# Patient Record
Sex: Female | Born: 1970 | Hispanic: Yes | Marital: Married | Smoking: Never smoker
Health system: Southern US, Community
[De-identification: ages and names within clinical notes are randomized; demographics above are authoritative.]

## PROBLEM LIST (undated history)

## (undated) DIAGNOSIS — C801 Malignant (primary) neoplasm, unspecified: Secondary | ICD-10-CM

---

## 1998-08-01 HISTORY — PX: CHOLECYSTECTOMY: SHX55

## 2006-05-31 ENCOUNTER — Ambulatory Visit: Payer: Self-pay | Admitting: Family Medicine

## 2012-10-31 ENCOUNTER — Ambulatory Visit: Payer: Self-pay

## 2012-11-28 ENCOUNTER — Ambulatory Visit: Payer: Self-pay

## 2014-10-20 ENCOUNTER — Ambulatory Visit: Payer: Self-pay | Admitting: Family Medicine

## 2015-11-25 ENCOUNTER — Ambulatory Visit: Payer: Self-pay | Attending: Oncology | Admitting: *Deleted

## 2015-11-25 ENCOUNTER — Ambulatory Visit
Admission: RE | Admit: 2015-11-25 | Discharge: 2015-11-25 | Disposition: A | Discharge status: Home / Self Care | Payer: Self-pay | Source: Ambulatory Visit | Attending: Oncology | Admitting: Oncology

## 2015-11-25 ENCOUNTER — Encounter: Payer: Self-pay | Admitting: *Deleted

## 2015-11-25 VITALS — BP 134/85 | HR 75 | Temp 97.7°F | Ht 59.45 in | Wt 161.6 lb

## 2015-11-25 DIAGNOSIS — Z0000 Encounter for general adult medical examination without abnormal findings: Secondary | ICD-10-CM

## 2015-11-25 NOTE — Patient Instructions (Signed)
Gave patient hand-out, Women Staying Healthy, Active and Well from [facility], with education on breast health, pap smears, heart and colon health. 

## 2015-11-25 NOTE — Progress Notes (Signed)
Subjective:     Patient ID: Diane Bates, female   DOB: [birth year], 45 y.o.   MRN: [id number]  HPI   Review of Systems     Objective:   Physical Exam  Pulmonary/Chest: Right breast exhibits no inverted nipple, no mass, no nipple discharge, no skin change and no tenderness. Left breast exhibits no inverted nipple, no mass, no nipple discharge, no skin change and no tenderness.       Assessment:     45 year old Hispanic female presents to [facility] for annual screening.  Lloyda, the interpreter present during the interview and exam.  Clinical breast exam unremarkable.  Taught self breast awareness.  Patient has been screened for eligibility.  She does not have any insurance, Medicare or Medicaid.  She also meets financial eligibility.  Hand-out given on the Affordable Care Act.     Plan:     Screening mammogram ordered.  Will follow-up per [facility] protocol.

## 2015-11-26 ENCOUNTER — Other Ambulatory Visit: Payer: Self-pay | Admitting: *Deleted

## 2015-11-26 DIAGNOSIS — R920 Mammographic microcalcification found on diagnostic imaging of breast: Secondary | ICD-10-CM

## 2015-12-04 ENCOUNTER — Other Ambulatory Visit: Payer: Self-pay | Admitting: *Deleted

## 2015-12-04 ENCOUNTER — Ambulatory Visit
Admission: RE | Admit: 2015-12-04 | Discharge: 2015-12-04 | Disposition: A | Discharge status: Home / Self Care | Payer: Self-pay | Source: Ambulatory Visit | Attending: Oncology | Admitting: Oncology

## 2015-12-04 DIAGNOSIS — R920 Mammographic microcalcification found on diagnostic imaging of breast: Secondary | ICD-10-CM

## 2015-12-07 ENCOUNTER — Other Ambulatory Visit: Payer: Self-pay | Admitting: *Deleted

## 2015-12-07 DIAGNOSIS — R920 Mammographic microcalcification found on diagnostic imaging of breast: Secondary | ICD-10-CM

## 2015-12-08 ENCOUNTER — Other Ambulatory Visit: Payer: Self-pay | Admitting: *Deleted

## 2015-12-08 DIAGNOSIS — R920 Mammographic microcalcification found on diagnostic imaging of breast: Secondary | ICD-10-CM

## 2015-12-14 ENCOUNTER — Ambulatory Visit
Admission: RE | Admit: 2015-12-14 | Discharge: 2015-12-14 | Disposition: A | Discharge status: Home / Self Care | Payer: Self-pay | Source: Ambulatory Visit | Attending: Oncology | Admitting: Oncology

## 2015-12-14 DIAGNOSIS — R920 Mammographic microcalcification found on diagnostic imaging of breast: Secondary | ICD-10-CM

## 2015-12-14 HISTORY — PX: BREAST BIOPSY: SHX20

## 2015-12-16 LAB — SURGICAL PATHOLOGY

## 2015-12-17 NOTE — Progress Notes (Signed)
Attempted to reach patient by phone, with interpreter, but no answer, and voicemail not set up.  Sent letter to be translated requesting that patient call Tanya Nones RN to discuss biopsy results, and recommendation.  Alean Rinne called with patient on the phone.  Pathology reviewed with patient,  and she is scheduled to see Dr. Bary Castilla on 12/29/15 at 11:00 with an interpreter.

## 2015-12-29 ENCOUNTER — Ambulatory Visit (INDEPENDENT_AMBULATORY_CARE_PROVIDER_SITE_OTHER): Payer: PRIVATE HEALTH INSURANCE | Admitting: General Surgery

## 2015-12-29 ENCOUNTER — Other Ambulatory Visit: Payer: Self-pay

## 2015-12-29 ENCOUNTER — Encounter: Payer: Self-pay | Admitting: General Surgery

## 2015-12-29 VITALS — BP 120/78 | HR 74 | Resp 12 | Ht 60.0 in | Wt 162.0 lb

## 2015-12-29 DIAGNOSIS — N6092 Unspecified benign mammary dysplasia of left breast: Secondary | ICD-10-CM

## 2015-12-29 DIAGNOSIS — N62 Hypertrophy of breast: Secondary | ICD-10-CM

## 2015-12-29 DIAGNOSIS — N6320 Unspecified lump in the left breast, unspecified quadrant: Secondary | ICD-10-CM

## 2015-12-29 NOTE — Patient Instructions (Addendum)
Biopsia de mama (Breast Biopsy) Una biopsia de mama es un procedimiento en el cual se extrae Truddie Coco de tejido de la mama. El tejido se examina bajo el microscopio para determinar si hay clulas cancerosas. Una biopsia de mama se realiza cuando hay:  Un bulto en la mama no diagnosticado (tumor).  Anormalidades, hundimientos, costras o ulceraciones en el pezn.  Secrecin anormal del pezn, Dealer.  Enrojecimiento, hinchazn y [place].  Depsitos de calcio (calcificaciones) o anormalidades observadas en Lavinia Sharps, ecografa, o en la resonancia magntica (IRM).  Cambios sospechosos en la mama que se observan en la mamografa. Si se descubre que el tumor es canceroso (maligno) una biopsia de mama puede ayudar a Teacher, adult education cul es el mejor tratamiento para usted. Hay diferentes tipos de biopsia de mama. Hable con su mdico acerca de las opciones y cul es la mejor para usted.  INFORME A SU MDICO SOBRE:   Alergias a alimentos o medicamentos.  Medicamentos que [place], incluyendo vitaminas, hierbas, gotas oftlmicas, medicamentos de venta libre y cremas.  Uso de corticoides (por va oral o cremas).  Problemas anteriores debido a anestsicos o a medicamentos que [employer] sensibilidad.  Antecedentes de hemorragias o cogulos sanguneos.  Cirugas anteriores.  Otros problemas de salud, incluyendo diabetes y problemas renales.  Resfros o infecciones recientes.  Posibilidad de embarazo, si corresponde. [place].  Infeccin.  Reaccin alrgica a los medicamentos.  Hematomas e inflamacin de la mama.  Alteracin en la forma de la mama.  No se encuentra el bulto o la anormalidad.  Necesidad de [place]. ANTES DEL PROCEDIMIENTO   Pdale a alguna persona que la lleve a su casa luego del procedimiento.  No fume al menos las 2 semanas previas al procedimiento. Si fuma, abandone el hbito.  No beba  alcohol durante las 24 horas previas al procedimiento.  Use un buen sostn para el procedimiento.  El mdico puede realizar un procedimiento para Glass blower/designer un alambre (marcacin con Management consultant) o de una semilla que emite radiacin (marcacin con semilla radiactiva) en el ndulo mamario. Durante este procedimiento, se realiza una mamografa o una ecografa para ayudar a Marine scientist. El alambre o la semilla ayudarn al mdico a Pension scheme manager el ndulo cuando realiza la biopsia, especialmente si este no se palpable. PROCEDIMIENTO  Le administrarn medicamentos para adormecer el rea de la mama (anestesia local) o medicamentos para dormir durante el procedimiento (anestesia general). Los siguientes son los diferentes tipos de biopsia que se pueden Optometrist.   Aspiracin con aguja fina: se inserta una aguja delgada con [place] en el tumor de la mama. Con ella se extraen lquido y clulas que luego se observan en el microscopio. Si el tumor no se palpa, se realizar una ecografa para localizar el tumor y Catering manager en el rea correcta.   Biopsia con aguja gruesa: Maxwell Caul de seccin amplia (aguja gruesa) se inserta en el tumor entre 3 y 54 veces para obtener muestras de tejido. Se toma la muestra de tejido. La aguja se Water quality scientist correcto mediante el uso de una ecografa o una radiografa.   Biopsia estereotctica: se utilizan equipos radiogrficos y Ardelia Mems computadora para Physiological scientist imgenes de la tumoracin [place]. La computadora encuentra exactamente el ncleo en el que se debe insertar la aguja. Se toma una muestra de tejido.   Biopsia asistida por vaco: se realiza una pequea incisin (menos de  de pulgada [0,6 cm] ) en  la mama. Un equipo para biopsia que incluye una [place] y un suctor se pasan a travs de la incisin dentro del tejido [place]. El suctor suavemente drena tejido mamario anormal hacia la aguja para extraerlo. Este tipo de biopsia extrae una muestra mayor de  tejido que aquel que se extrae habitualmente con la biopsia con Oletta Lamas. No se necesitan puntos de sutura y habitualmente deja una cicatriz pequea.  Biopsia con aguja gruesa guiada por ultrasonido-Un ultrasonido de alta frecuencia gua a la aguja gruesa al rea de la masa o anormalidad. Se hace una incisin para insertar la aguja. Se toma una muestra de tejido.  Biopsia abierta: se hace una incisin grande en la mama. El mdico intentar extirpar todo el tumor de la mama o todo lo que pueda. DESPUS DEL PROCEDIMIENTO   La conducirn a la zona de recuperacin. Cuando se encuentre bien y no tenga problemas, podr volver a Medical illustrator.  Podr notar hematomas en la mama. Esto es normal.  El mdico puede aplicar un vendaje compresivo sobre la mama durante 24 - 56 horas. El vendaje compresivo se ajuste de manera apretada alrededor del trax para evitar que se acumule lquido debajo de los tejidos.   Esta informacin no tiene Marine scientist el consejo del mdico. Asegrese de hacerle al mdico cualquier pregunta que tenga.   Document Released: 04/27/2005 Document Revised: 04/08/2015 Elsevier Interactive Patient Education [employer].  Patient is scheduled for surgery at [facility] on 01/14/16. She will Pre Admit at the hospital on 01/06/16 at 9:30 am. Patient is aware of dates, time, and instructions.

## 2015-12-29 NOTE — Progress Notes (Signed)
Patient ID: Diane Bates, female   DOB: [birth year], 45 y.o.   MRN: [id number]  Chief Complaint  Patient presents with  . Other    HPI Diane Bates is a 45 y.o. female who presents for a breast evaluation. The most recent mammogram was done on 11/25/15 and left breast stereo biopsy on 12/17/15.  Patient does perform regular self breast checks and gets regular mammograms done.    The patient underwent screening mammogram and was called back for additional views. This confirmed microcalcifications which are new from previous exams. She underwent stereotactic biopsy with identification of atypical ductal hyperplasia  No family history of breast cancer. Interpreter Loyda present.   I personally reviewed the patient's history. HPI  No past medical history on file.  Past Surgical History  Procedure Laterality Date  . Breast biopsy Left 12/14/2015    stereo, path pending  . Cholecystectomy  2000    Family History  Problem Relation Age of Onset  . Breast cancer Neg Hx     Social History Social History  Substance Use Topics  . Smoking status: Never Smoker   . Smokeless tobacco: None  . Alcohol Use: No    No Known Allergies  No current outpatient prescriptions on file.   No current facility-administered medications for this visit.    Review of Systems Review of Systems  Constitutional: Negative.   Respiratory: Negative.   Cardiovascular: Negative.     Blood pressure 120/78, pulse 74, resp. rate 12, height 5' (1.524 m), weight 162 lb (73.483 kg), last menstrual period 10/25/2015.  Physical Exam Physical Exam  Constitutional: She is oriented to person, place, and time. She appears well-developed and well-nourished.  Eyes: Conjunctivae are normal. No scleral icterus.  Neck: Neck supple.  Cardiovascular: Normal rate, regular rhythm and normal heart sounds.   Pulmonary/Chest: Effort normal and breath sounds normal. Right breast exhibits no inverted nipple, no  mass, no nipple discharge, no skin change and no tenderness. Left breast exhibits no inverted nipple, no nipple discharge, no skin change and no tenderness.    Thickening at 12 o'clock 6 cm from nipple left breast.  Abdominal: Soft. Bowel sounds are normal. There is no tenderness.  Lymphadenopathy:    She has no cervical adenopathy.    She has no axillary adenopathy.  Neurological: She is alert and oriented to person, place, and time.  Skin: Skin is warm and dry.    Data Reviewed Screening mammograms dated 11/25/2015 suggested new calcifications in the left breast. BI-RADS 0.  Focal spot compression views of the left breast dated 12/04/2015 suggested a 4 mm group of course calcifications, BI-RADS-4.  Stereotactic biopsy completed 12/14/2015 showed atypical ductal hyperplasia and stromal mucin pools. Excisional biopsy recommended. Mucinous carcinoma could not be excluded.  Ultrasound examination of the left breast was undertaken. This was done to determine if the biopsy site could be identified on ultrasound. In the left breast at the 12:00 position 5 cm from the nipple the area of the recent biopsy is identified measuring 0.8 x 0.9 x 1.5 cm. Biopsy clip is clearly evident.  Assessment    Atypical ductal hyperplasia of the left breast.    Plan    Visual biopsy is been recommended to confirm no upstaging to DCIS or invasive cancer.  The procedure was reviewed in detail with the patient and her husband making use of the translator.    Patient to schedule breast excision at outpatient surgery.  Patient is scheduled for surgery at [facility] on  01/14/16. She will Pre Admit at the hospital on 01/06/16 at 9:30 am. Patient is aware of dates, time, and instructions.   PCP:  Sabino Snipes [place] This information has been scribed by Gaspar Cola CMA.    [name] 12/30/2015, 8:51 PM

## 2015-12-30 DIAGNOSIS — N6092 Unspecified benign mammary dysplasia of left breast: Secondary | ICD-10-CM | POA: Insufficient documentation

## 2015-12-30 NOTE — H&P (Signed)
HPI  Diane Bates is a 45 y.o. female who presents for a breast evaluation. The most recent mammogram was done on 11/25/15 and left breast stereo biopsy on 12/17/15.  Patient does perform regular self breast checks and gets regular mammograms done.  The patient underwent screening mammogram and was called back for additional views. This confirmed microcalcifications which are new from previous exams. She underwent stereotactic biopsy with identification of atypical ductal hyperplasia  No family history of breast cancer.  Interpreter Loyda present.  I personally reviewed the patient's history.  HPI  No past medical history on file.  Past Surgical History   Procedure  Laterality  Date   .  Breast biopsy  Left  12/14/2015     stereo, path pending   .  Cholecystectomy   2000    Family History   Problem  Relation  Age of Onset   .  Breast cancer  Neg Hx     Social History  Social History   Substance Use Topics   .  Smoking status:  Never Smoker   .  Smokeless tobacco:  None   .  Alcohol Use:  No    No Known Allergies  No current outpatient prescriptions on file.    No current facility-administered medications for this visit.    Review of Systems  Review of Systems  Constitutional: Negative.  Respiratory: Negative.  Cardiovascular: Negative.   Blood pressure 120/78, pulse 74, resp. rate 12, height 5' (1.524 m), weight 162 lb (73.483 kg), last menstrual period 10/25/2015.  Physical Exam  Physical Exam  Constitutional: She is oriented to person, place, and time. She appears well-developed and well-nourished.  Eyes: Conjunctivae are normal. No scleral icterus.  Neck: Neck supple.  Cardiovascular: Normal rate, regular rhythm and normal heart sounds.  Pulmonary/Chest: Effort normal and breath sounds normal. Right breast exhibits no inverted nipple, no mass, no nipple discharge, no skin change and no tenderness. Left breast exhibits no inverted nipple, no nipple discharge, no skin  change and no tenderness.    Thickening at 12 o'clock 6 cm from nipple left breast.  Abdominal: Soft. Bowel sounds are normal. There is no tenderness.  Lymphadenopathy:  She has no cervical adenopathy.  She has no axillary adenopathy.  Neurological: She is alert and oriented to person, place, and time.  Skin: Skin is warm and dry.   Data Reviewed  Screening mammograms dated 11/25/2015 suggested new calcifications in the left breast. BI-RADS 0.  Focal spot compression views of the left breast dated 12/04/2015 suggested a 4 mm group of course calcifications, BI-RADS-4.  Stereotactic biopsy completed 12/14/2015 showed atypical ductal hyperplasia and stromal mucin pools. Excisional biopsy recommended. Mucinous carcinoma could not be excluded.  Ultrasound examination of the left breast was undertaken. This was done to determine if the biopsy site could be identified on ultrasound. In the left breast at the 12:00 position 5 cm from the nipple the area of the recent biopsy is identified measuring 0.8 x 0.9 x 1.5 cm. Biopsy clip is clearly evident.  Assessment   Atypical ductal hyperplasia of the left breast.   Plan   Visual biopsy is been recommended to confirm no upstaging to DCIS or invasive cancer.  The procedure was reviewed in detail with the patient and her husband making use of the translator.   Patient to schedule breast excision at outpatient surgery.  Patient is scheduled for surgery at [facility] on 01/14/16. She will Pre Admit at the hospital on 01/06/16 at  9:30 am. Patient is aware of dates, time, and instructions.  PCP: Sabino Snipes [place]  This information has been scribed by Gaspar Cola CMA.  [name]  12/30/2015, 8:51 PM

## 2016-01-06 ENCOUNTER — Encounter
Admission: RE | Admit: 2016-01-06 | Discharge: 2016-01-06 | Disposition: A | Discharge status: Home / Self Care | Payer: Self-pay | Source: Ambulatory Visit | Attending: General Surgery | Admitting: General Surgery

## 2016-01-06 DIAGNOSIS — Z01818 Encounter for other preprocedural examination: Secondary | ICD-10-CM | POA: Insufficient documentation

## 2016-01-06 NOTE — Patient Instructions (Signed)
  Your procedure is scheduled on: January 14, 2016 (Thursday) Report to Same Day Surgery 2nd floor [facility] To find out your arrival time please call [phone] between 1PM - 3PM on January 13, 2016 (Wednesday)  Remember: Instructions that are not followed completely may result in serious medical risk, up to and including death, or upon the discretion of your surgeon and anesthesiologist your surgery may need to be rescheduled.    _x___ 1. Do not eat food or drink liquids after midnight. No gum chewing or hard candies.     ___ 2. No Alcohol for 24 hours before or after surgery.   ____ 3. Bring all medications with you on the day of surgery if instructed.    __x__ 4. Notify your doctor if there is any change in your medical condition     (cold, fever, infections).     Do not wear jewelry, make-up, hairpins, clips or nail polish.  Do not wear lotions, powders, or perfumes. You may wear deodorant.  Do not shave 48 hours prior to surgery. Men may shave face and neck.  Do not bring valuables to the hospital.    [facility] is not responsible for any belongings or valuables.               Contacts, dentures or bridgework may not be worn into surgery.  Leave your suitcase in the car. After surgery it may be brought to your room.  For patients admitted to the hospital, discharge time is determined by your treatment team.   Patients discharged the day of surgery will not be allowed to drive home.    Please read over the following fact sheets that you were given:   [facility] Preparing for Surgery and or MRSA Information   ____ Take these medicines the morning of surgery with A SIP OF WATER:    1.   2.  3.  4.  5.  6.  ____ Fleet Enema (as directed)   _x___ Use CHG Soap or sage wipes as directed on instruction sheet   ____ Use inhalers on the day of surgery and bring to hospital day of surgery  ____ Stop metformin 2 days prior to surgery    ____ Take 1/2 of usual insulin dose  the night before surgery and none on the morning of surgery          _x___ Stop aspirin or coumadin, or plavix (N/A)  _x__ Stop Anti-inflammatories such as Advil, Aleve, Ibuprofen, Motrin, Naproxen,          Naprosyn, Goodies powders or aspirin products. Ok to take Tylenol.   ____ Stop supplements until after surgery.    ____ Bring C-Pap to the hospital.

## 2016-01-14 ENCOUNTER — Ambulatory Visit: Payer: Self-pay | Admitting: Anesthesiology

## 2016-01-14 ENCOUNTER — Encounter
Admission: RE | Disposition: A | Discharge status: Home / Self Care | Payer: Self-pay | Source: Ambulatory Visit | Attending: General Surgery

## 2016-01-14 ENCOUNTER — Ambulatory Visit
Admission: RE | Admit: 2016-01-14 | Discharge: 2016-01-14 | Disposition: A | Discharge status: Home / Self Care | Payer: Self-pay | Source: Ambulatory Visit | Attending: General Surgery | Admitting: General Surgery

## 2016-01-14 ENCOUNTER — Encounter: Payer: Self-pay | Admitting: *Deleted

## 2016-01-14 DIAGNOSIS — R921 Mammographic calcification found on diagnostic imaging of breast: Secondary | ICD-10-CM

## 2016-01-14 DIAGNOSIS — N6092 Unspecified benign mammary dysplasia of left breast: Secondary | ICD-10-CM | POA: Insufficient documentation

## 2016-01-14 DIAGNOSIS — D4860 Neoplasm of uncertain behavior of unspecified breast: Secondary | ICD-10-CM | POA: Diagnosis not present

## 2016-01-14 HISTORY — PX: BREAST EXCISIONAL BIOPSY: SUR124

## 2016-01-14 HISTORY — PX: BREAST BIOPSY: SHX20

## 2016-01-14 LAB — POCT PREGNANCY, URINE: PREG TEST UR: NEGATIVE

## 2016-01-14 SURGERY — BREAST BIOPSY
Anesthesia: General | Laterality: Left | Wound class: Clean

## 2016-01-14 MED ORDER — LIDOCAINE HCL (CARDIAC) 20 MG/ML IV SOLN
INTRAVENOUS | Status: DC | PRN
  Administered 2016-01-14: 100 mg via INTRAVENOUS

## 2016-01-14 MED ORDER — HYDROCODONE-ACETAMINOPHEN 5-325 MG PO TABS
ORAL_TABLET | ORAL | Status: DC
Start: 2016-01-14 — End: 2016-01-14
  Filled 2016-01-14: qty 1

## 2016-01-14 MED ORDER — ONDANSETRON HCL 4 MG/2ML IJ SOLN
INTRAMUSCULAR | Status: DC | PRN
Start: 2016-01-14 — End: 2016-01-14
  Administered 2016-01-14: 4 mg via INTRAVENOUS

## 2016-01-14 MED ORDER — FENTANYL CITRATE (PF) 100 MCG/2ML IJ SOLN
INTRAMUSCULAR | Status: DC
Start: 2016-01-14 — End: 2016-01-14
  Filled 2016-01-14: qty 2

## 2016-01-14 MED ORDER — LACTATED RINGERS IV SOLN
INTRAVENOUS | Status: DC
Start: 2016-01-14 — End: 2016-01-14
  Administered 2016-01-14 (×2): via INTRAVENOUS

## 2016-01-14 MED ORDER — FAMOTIDINE 20 MG PO TABS
20.0000 mg | ORAL_TABLET | Freq: Once | ORAL | Status: AC
  Administered 2016-01-14: 20 mg via ORAL

## 2016-01-14 MED ORDER — ONDANSETRON HCL 4 MG/2ML IJ SOLN: 4.0000 mg | Freq: Once | INTRAMUSCULAR | Status: DC | PRN

## 2016-01-14 MED ORDER — PROPOFOL 10 MG/ML IV BOLUS
INTRAVENOUS | Status: DC | PRN
  Administered 2016-01-14: 180 mg via INTRAVENOUS

## 2016-01-14 MED ORDER — FENTANYL CITRATE (PF) 100 MCG/2ML IJ SOLN
INTRAMUSCULAR | Status: DC | PRN
  Administered 2016-01-14: 100 ug via INTRAVENOUS

## 2016-01-14 MED ORDER — BUPIVACAINE-EPINEPHRINE (PF) 0.5% -1:200000 IJ SOLN
INTRAMUSCULAR | Status: AC
  Filled 2016-01-14: qty 30

## 2016-01-14 MED ORDER — HYDROCODONE-ACETAMINOPHEN 5-325 MG PO TABS: 1.0000 | ORAL_TABLET | ORAL | Status: DC | PRN

## 2016-01-14 MED ORDER — HYDROCODONE-ACETAMINOPHEN 5-325 MG PO TABS
1.0000 | ORAL_TABLET | Freq: Once | ORAL | Status: AC
  Administered 2016-01-14: 1 via ORAL

## 2016-01-14 MED ORDER — DEXAMETHASONE SODIUM PHOSPHATE 10 MG/ML IJ SOLN
INTRAMUSCULAR | Status: DC | PRN
  Administered 2016-01-14: 5 mg via INTRAVENOUS

## 2016-01-14 MED ORDER — MIDAZOLAM HCL 2 MG/2ML IJ SOLN
INTRAMUSCULAR | Status: DC | PRN
  Administered 2016-01-14: 2 mg via INTRAVENOUS

## 2016-01-14 MED ORDER — FENTANYL CITRATE (PF) 100 MCG/2ML IJ SOLN
25.0000 ug | INTRAMUSCULAR | Status: DC | PRN
  Administered 2016-01-14: 25 ug via INTRAVENOUS

## 2016-01-14 MED ORDER — HYDROMORPHONE HCL 1 MG/ML IJ SOLN
INTRAMUSCULAR | Status: DC | PRN
  Administered 2016-01-14: 0.5 mg via INTRAVENOUS

## 2016-01-14 MED ORDER — FAMOTIDINE 20 MG PO TABS
ORAL_TABLET | ORAL | Status: AC
  Administered 2016-01-14: 20 mg via ORAL
  Filled 2016-01-14: qty 1

## 2016-01-14 MED ORDER — BUPIVACAINE-EPINEPHRINE (PF) 0.5% -1:200000 IJ SOLN
INTRAMUSCULAR | Status: DC | PRN
  Administered 2016-01-14: 30 mL

## 2016-01-14 SURGICAL SUPPLY — 38 items
BANDAGE ELASTIC 6 LF NS (GAUZE/BANDAGES/DRESSINGS) ×2 IMPLANT
BLADE SURG 15 STRL SS SAFETY (BLADE) ×4 IMPLANT
BNDG GAUZE 4.5X4.1 6PLY STRL (MISCELLANEOUS) ×2 IMPLANT
CANISTER SUCT 1200ML W/VALVE (MISCELLANEOUS) ×2 IMPLANT
CHLORAPREP W/TINT 26ML (MISCELLANEOUS) ×2 IMPLANT
CNTNR SPEC 2.5X3XGRAD LEK (MISCELLANEOUS)
CONT SPEC 4OZ STER OR WHT (MISCELLANEOUS)
CONTAINER SPEC 2.5X3XGRAD LEK (MISCELLANEOUS) IMPLANT
COVER PROBE FLX POLY STRL (MISCELLANEOUS) ×2 IMPLANT
DEVICE DUBIN SPECIMEN MAMMOGRA (MISCELLANEOUS) ×2 IMPLANT
DRAPE LAPAROTOMY 100X77 ABD (DRAPES) ×2 IMPLANT
DRESSING TELFA 4X3 1S ST N-ADH (GAUZE/BANDAGES/DRESSINGS) ×2 IMPLANT
ELECT CAUTERY BLADE TIP 2.5 (TIP) ×2
ELECT REM PT RETURN 9FT ADLT (ELECTROSURGICAL) ×2
ELECTRODE CAUTERY BLDE TIP 2.5 (TIP) ×1 IMPLANT
ELECTRODE REM PT RTRN 9FT ADLT (ELECTROSURGICAL) ×1 IMPLANT
GAUZE FLUFF 18X24 1PLY STRL (GAUZE/BANDAGES/DRESSINGS) ×2 IMPLANT
GLOVE BIO SURGEON STRL SZ7.5 (GLOVE) ×8 IMPLANT
GLOVE INDICATOR 8.0 STRL GRN (GLOVE) ×6 IMPLANT
GOWN STRL REUS W/ TWL LRG LVL3 (GOWN DISPOSABLE) ×3 IMPLANT
GOWN STRL REUS W/TWL LRG LVL3 (GOWN DISPOSABLE) ×3
HARMONIC SCALPEL FOCUS (MISCELLANEOUS) IMPLANT
KIT RM TURNOVER STRD PROC AR (KITS) ×2 IMPLANT
LABEL OR SOLS (LABEL) ×2 IMPLANT
MARGIN MAP 10MM (MISCELLANEOUS) ×2 IMPLANT
NDL SAFETY 22GX1.5 (NEEDLE) ×2 IMPLANT
NEEDLE HYPO 25X1 1.5 SAFETY (NEEDLE) ×2 IMPLANT
PACK BASIN MINOR ARMC (MISCELLANEOUS) ×2 IMPLANT
STRIP CLOSURE SKIN 1/2X4 (GAUZE/BANDAGES/DRESSINGS) ×2 IMPLANT
SUT ETHILON 3-0 FS-10 30 BLK (SUTURE) ×4
SUT VIC AB 2-0 CT1 27 (SUTURE) ×2
SUT VIC AB 2-0 CT1 TAPERPNT 27 (SUTURE) ×2 IMPLANT
SUT VIC AB 4-0 FS2 27 (SUTURE) ×2 IMPLANT
SUTURE EHLN 3-0 FS-10 30 BLK (SUTURE) ×2 IMPLANT
SWABSTK COMLB BENZOIN TINCTURE (MISCELLANEOUS) ×2 IMPLANT
SYR CONTROL 10ML (SYRINGE) IMPLANT
TAPE TRANSPORE STRL 2 31045 (GAUZE/BANDAGES/DRESSINGS) IMPLANT
WATER STERILE IRR 1000ML POUR (IV SOLUTION) ×2 IMPLANT

## 2016-01-14 NOTE — Discharge Instructions (Signed)
CIRUGIA AMBULATORIA       Instruccionnes de alta    1.  Las drogas que se Statistician en su cuerpo [employer], asi      que por las proximas 24 horas usted no debe:   Conducir Scientist, research (medical)) un automovil   Hacer ninguna decision legal   Tomar ninguna bebida alcoholica  2.  A) Manana puede comenzar una dieta regular.  Es mejor que hoy empiece con                    liquidos y gradualmente anada comidas solidas.       B) Puede comer cualquier comida que desee pero es mejor empezar con liquidos,               luego sopitas con galletas saladas y gradualmente llegar a las comidas solidas.  3.  Por favor avise a su medico inmediatamente si usted tiene algun sangrado anormal,       tiene dificultad con la respiracion, enrojecimiento y Social research officer, government en el sitio de la cirugia,     Captree, fiebro o dolor que se alivia con [place].  4.  A) Su visita posoperatoria (despues de su operacion) es con el         B)  Por favor llame para hacer la cita posoperatoria.  5.  Istrucciones especificas :     Biopsia de mama (Breast Biopsy) Una biopsia de mama es un procedimiento en el cual se extrae Truddie Coco de tejido de la mama. El tejido se examina bajo el microscopio para determinar si hay clulas cancerosas. Una biopsia de mama se realiza cuando hay:  Un bulto en la mama no diagnosticado (tumor).  Anormalidades, hundimientos, costras o ulceraciones en el pezn.  Secrecin anormal del pezn, Dealer.  Enrojecimiento, hinchazn y [place].  Depsitos de calcio (calcificaciones) o anormalidades observadas en Lavinia Sharps, ecografa, o en la resonancia magntica (IRM).  Cambios sospechosos en la mama que se observan en la mamografa. Si se descubre que el tumor es canceroso (maligno) una biopsia de mama puede ayudar a Teacher, adult education cul es el mejor tratamiento para usted. Hay diferentes tipos de biopsia de mama. Hable con su mdico acerca de las opciones y cul  es la mejor para usted.  INFORME A SU MDICO SOBRE:   Alergias a alimentos o medicamentos.  Medicamentos que [place], incluyendo vitaminas, hierbas, gotas oftlmicas, medicamentos de venta libre y cremas.  Uso de corticoides (por va oral o cremas).  Problemas anteriores debido a anestsicos o a medicamentos que [employer] sensibilidad.  Antecedentes de hemorragias o cogulos sanguneos.  Cirugas anteriores.  Otros problemas de salud, incluyendo diabetes y problemas renales.  Resfros o infecciones recientes.  Posibilidad de embarazo, si corresponde. [place].  Infeccin.  Reaccin alrgica a los medicamentos.  Hematomas e inflamacin de la mama.  Alteracin en la forma de la mama.  No se encuentra el bulto o la anormalidad.  Necesidad de [place]. ANTES DEL PROCEDIMIENTO   Pdale a alguna persona que la lleve a su casa luego del procedimiento.  No fume al menos las 2 semanas previas al procedimiento. Si fuma, abandone el hbito.  No beba alcohol durante las 24 horas previas al procedimiento.  Use un buen sostn para el procedimiento.  El mdico puede realizar un procedimiento para Glass blower/designer un alambre (marcacin con Management consultant) o de una semilla que emite radiacin (marcacin con semilla radiactiva) en el ndulo  mamario. Durante este procedimiento, se realiza una mamografa o una ecografa para ayudar a Marine scientist. El alambre o la semilla ayudarn al mdico a Pension scheme manager el ndulo cuando realiza la biopsia, especialmente si este no se palpable. PROCEDIMIENTO  Le administrarn medicamentos para adormecer el rea de la mama (anestesia local) o medicamentos para dormir durante el procedimiento (anestesia general). Los siguientes son los diferentes tipos de biopsia que se pueden Optometrist.   Aspiracin con aguja fina: se inserta una aguja delgada con Cl[place] Jacks en el tumor de la mama. Con ella se extraen lquido y clulas que  luego se observan en el microscopio. Si el tumor no se palpa, se realizar una ecografa para localizar el tumor y Catering manager en el rea correcta.   Biopsia con aguja gruesa: Maxwell Caul de seccin amplia (aguja gruesa) se inserta en el tumor entre 3 y 68 veces para obtener muestras de tejido. Se toma la muestra de tejido. La aguja se Water quality scientist correcto mediante el uso de una ecografa o una radiografa.   Biopsia estereotctica: se utilizan equipos radiogrficos y Ardelia Mems computadora para Physiological scientist imgenes de la tumoracin [place]. La computadora encuentra exactamente el ncleo en el que se debe insertar la aguja. Se toma una muestra de tejido.   Biopsia asistida por vaco: se realiza una pequea incisin (menos de  de pulgada [0,6 cm] ) en la mama. Un equipo para biopsia que incluye una [place] y un suctor se pasan a travs de la incisin dentro del tejido Mayer. El suctor suavemente drena tejido mamario anormal hacia la aguja para extraerlo. Este tipo de biopsia extrae una muestra mayor de tejido que aquel que se extrae habitualmente con la biopsia con Oletta Lamas. No se necesitan puntos de sutura y habitualmente deja una cicatriz pequea.  Biopsia con aguja gruesa guiada por ultrasonido-Un ultrasonido de alta frecuencia gua a la aguja gruesa al rea de la masa o anormalidad. Se hace una incisin para insertar la aguja. Se toma una muestra de tejido.  Biopsia abierta: se hace una incisin grande en la mama. El mdico intentar extirpar todo el tumor de la mama o todo lo que pueda. DESPUS DEL PROCEDIMIENTO   La conducirn a la zona de recuperacin. Cuando se encuentre bien y no tenga problemas, podr volver a Medical illustrator.  Podr notar hematomas en la mama. Esto es normal.  El mdico puede aplicar un vendaje compresivo sobre la mama durante 24 - 59 horas. El vendaje compresivo se ajuste de manera apretada alrededor del trax para evitar que se acumule lquido debajo de los  tejidos.   Esta informacin no tiene Marine scientist el consejo del mdico. Asegrese de hacerle al mdico cualquier pregunta que tenga.   Document Released: 04/27/2005 Document Revised: 04/08/2015 Elsevier Interactive Patient Education 2016 [place]  (Breast Biopsy, Care After)  Estas indicaciones le proporcionan informacin general acerca de cmo deber cuidarse despus del procedimiento. El mdico tambin podr darle instrucciones especficas. Comunquese con el mdico si tiene algn problema o tiene preguntas despus del procedimiento. CUIDADOS EN EL HOGAR   Slo tome los medicamentos segn le indique el mdico.  No tome aspirina.  Mantenga las suturas (puntos) secos cuando se bae.  Proteja la zona de la biopsia. No deje que la zona se inflame.  Evite las actividades que podran tironear y abrir el sitio de la biopsia hasta que su mdico la autorice. Aqu se  incluye:  Elongar.  Estirarse.  La prctica de ejercicios.  Deportes.  Levantar objetos que pesen ms de 3 lb. (1,300 kg.)  Siga su dieta habitual.  Use un buen sostn de soporte durante el tiempo que le indique su mdico.  Cambie los apsitosvendajes) tal como se le indic.  No beba alcohol si toma analgsicos.  Cumpla con los controles mdicos segn las indicaciones. Consulte la fecha en que los resultados estarn disponibles. Asegrese de [employer]. SOLICITE AYUDA DE INMEDIATO SI:   Tiene fiebre.  Aumenta el sangrado (ms de una pequea mancha) en el lugar de la biopsia.  Tiene dificultad para respirar.  Observa una secrecin de color blanco amarillento (pus) que proviene del sitio de la biopsia.  Presenta enrojecimiento, inflamacin (hinchazn),o aumento del dolor en el sitio de la biopsia.  Siente mal olor en el sitio de la biopsia.  La biopsia se abre despus de que le han retirado los [employer] grapas o la [place].  Tiene una erupcin.  Necesita medicamentos ms fuertes. ASEGRESE DE QUE:   Comprende estas instrucciones.  Controlar su enfermedad.  Solicitar ayuda de inmediato si no mejora o si empeora.   Esta informacin no tiene Marine scientist el consejo del mdico. Asegrese de hacerle al mdico cualquier pregunta que tenga.   Document Released: 01/17/2012 Elsevier Interactive Patient Education [employer].

## 2016-01-14 NOTE — H&P (Signed)
No change in clinical history or exam.  For left breast excision for ADH.

## 2016-01-14 NOTE — Anesthesia Preprocedure Evaluation (Signed)
Anesthesia Evaluation  Patient identified by MRN, date of birth, ID band Patient awake    Reviewed: Allergy & Precautions, NPO status , Patient's Chart, lab work & pertinent test results  History of Anesthesia Complications Negative for: history of anesthetic complications  Airway Mallampati: II       Dental   Pulmonary neg pulmonary ROS,           Cardiovascular negative cardio ROS       Neuro/Psych negative neurological ROS     GI/Hepatic negative GI ROS, Neg liver ROS,   Endo/Other  negative endocrine ROS  Renal/GU negative Renal ROS     Musculoskeletal   Abdominal   Peds  Hematology negative hematology ROS (+)   Anesthesia Other Findings   Reproductive/Obstetrics                             Anesthesia Physical Anesthesia Plan  ASA: II  Anesthesia Plan: General   Post-op Pain Management:    Induction: Intravenous  Airway Management Planned: LMA  Additional Equipment:   Intra-op Plan:   Post-operative Plan:   Informed Consent: I have reviewed the patients History and Physical, chart, labs and discussed the procedure including the risks, benefits and alternatives for the proposed anesthesia with the patient or authorized representative who has indicated his/her understanding and acceptance.     Plan Discussed with:   Anesthesia Plan Comments:         Anesthesia Quick Evaluation

## 2016-01-14 NOTE — Transfer of Care (Signed)
Immediate Anesthesia Transfer of Care Note  Patient: Diane Bates  Procedure(s) Performed: Procedure(s): BREAST BIOPSY (Left)  Patient Location: PACU  Anesthesia Type:General  Level of Consciousness: sedated and patient cooperative  Airway & Oxygen Therapy: Patient Spontanous Breathing and Patient connected to face mask oxygen  Post-op Assessment: Report given to RN and Post -op Vital signs reviewed and stable  Post vital signs: Reviewed and stable  Last Vitals:  Filed Vitals:   01/14/16 0613 01/14/16 0822  BP: 126/76 131/75  Pulse: 72 82  Temp: 36.7 C 36.1 C  Resp: 16 17    Last Pain: There were no vitals filed for this visit.       Complications: No apparent anesthesia complications

## 2016-01-14 NOTE — Anesthesia Procedure Notes (Signed)
Procedure Name: LMA Insertion Date/Time: 01/14/2016 7:36 AM Performed by: Justus Memory Pre-anesthesia Checklist: Patient identified, Emergency Drugs available, Suction available and Patient being monitored Patient Re-evaluated:Patient Re-evaluated prior to inductionOxygen Delivery Method: Circle system utilized Preoxygenation: Pre-oxygenation with 100% oxygen Intubation Type: IV induction LMA: LMA inserted LMA Size: 3.5 Number of attempts: 1 Placement Confirmation: positive ETCO2 and breath sounds checked- equal and bilateral

## 2016-01-14 NOTE — Anesthesia Postprocedure Evaluation (Signed)
Anesthesia Post Note  Patient: Diane Bates  Procedure(s) Performed: Procedure(s) (LRB): BREAST BIOPSY (Left)  Patient location during evaluation: PACU Anesthesia Type: General Level of consciousness: awake and alert Pain management: pain level controlled Vital Signs Assessment: post-procedure vital signs reviewed and stable Respiratory status: spontaneous breathing and respiratory function stable Cardiovascular status: stable Anesthetic complications: no    Last Vitals:  Filed Vitals:   01/14/16 0850 01/14/16 0852  BP:  129/76  Pulse: 82 84  Temp:  36.3 C  Resp: 17 17    Last Pain:  Filed Vitals:   01/14/16 0903  PainSc: Asleep                 KEPHART,WILLIAM K

## 2016-01-14 NOTE — Op Note (Signed)
Preoperative diagnosis: Left breast ADH, microcalcifications.  Postoperative diagnosis: Same.  Operative procedure: Left breast wide excision with ultrasound guidance.  Operating surgeon: Ollen Bowl, M.D.  Anesthesia: Gen. by LMA, Marcaine 0.5% with 1-200,000 epinephrine, 30 mL local infiltration.  Estimated blood loss: Less than 5 mL.  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 She was admitted for elective wide excision.  Operative note: With the patient under adequate general anesthesia the area was prepped with ChloraPrep and draped. Marcaine was infiltrated for postoperative analgesia. Ultrasound was used to confirm location of the previously placed biopsy clip at the 12-12:30 o'clock position 5 cm from the nipple. An area for excision was outlined. The skin was incised sharply and a curvilinear incision and hemostasis achieved by electrocautery. The adipose tissue was divided. A 3 x 3 x 3 cm block of breast parenchyma was removed and then an additional 1 cm block laterally removed. Specimens were orientated and specimen radiograph confirmed the clip in the center of the specimen. Hemostasis was electrocautery. The deep tissue was approximated with interrupted 2-0 Vicryl sutures. The adipose layer was approximated in similar fashion. The skin was closed with a running 4-0 Vicryl septic suture. Benzoin, Steri-Strips followed by Telfa dressing, fluff gauze, Kerlix and Ace wrap was applied.  The patient tolerated the procedure well was taken to recovery room stable condition.

## 2016-01-15 LAB — SURGICAL PATHOLOGY

## 2016-01-18 ENCOUNTER — Telehealth: Payer: Self-pay

## 2016-01-18 NOTE — Telephone Encounter (Signed)
-----   Message from [name], MD sent at 01/18/2016  1:40 PM EDT ----- Please as the translator service to contact the patient and let her know that the biopsy was benign. No cancer. We will need a translator at her follow-up visit to review treatment plans. Thank you ----- Message -----    From: [facility] Interface    Sent: 01/15/2016   4:45 PM      To: [name], MD

## 2016-01-18 NOTE — Telephone Encounter (Signed)
Notified patient as instructed via translator services, patient pleased. Discussed follow-up appointments, patient agrees.

## 2016-01-21 ENCOUNTER — Encounter: Payer: Self-pay | Admitting: General Surgery

## 2016-01-21 ENCOUNTER — Ambulatory Visit (INDEPENDENT_AMBULATORY_CARE_PROVIDER_SITE_OTHER): Payer: PRIVATE HEALTH INSURANCE | Admitting: General Surgery

## 2016-01-21 ENCOUNTER — Ambulatory Visit: Payer: PRIVATE HEALTH INSURANCE | Admitting: General Surgery

## 2016-01-21 VITALS — BP 122/76 | Ht 60.0 in | Wt 163.0 lb

## 2016-01-21 DIAGNOSIS — N6092 Unspecified benign mammary dysplasia of left breast: Secondary | ICD-10-CM

## 2016-01-21 DIAGNOSIS — N6089 Other benign mammary dysplasias of unspecified breast: Secondary | ICD-10-CM

## 2016-01-21 MED ORDER — TAMOXIFEN CITRATE 20 MG PO TABS: 20.0000 mg | ORAL_TABLET | Freq: Every day | ORAL | Status: DC

## 2016-01-21 NOTE — Patient Instructions (Addendum)
The patient is aware to call back for any questions or concerns.Tamoxifen daily  Tamoxifen oral tablet Qu es este medicamento? El TAMOXIFENO bloquea los efectos de la hormona estrognica. El medicamento se [place] generalmente para el tratamiento del cncer de mama. Se utiliza tambin para reducir la posibilidad de que el cncer de mama vuelva a aparecer en mujeres tratadas anteriormente para esta enfermedad. Adems, ayuda a Manufacturing engineer de mama en algunas mujeres que tienen alto riesgo de desarrollarlo. Este medicamento puede ser utilizado para otros usos; si tiene alguna pregunta consulte con su proveedor de atencin mdica o con su farmacutico. Qu le debo informar a mi profesional de la salud antes de tomar este medicamento? Necesita saber si usted presenta alguno de los siguientes problemas o situaciones: -cogulos sanguneos -trastornos sanguneos -cataratas o deterioro de la vista -endometriosis -niveles elevados de calcio -alto nivel de colesterol -ciclos menstruales irregulares -enfermedad heptica -derrame cerebral -fibroides uterinos -una reaccin alrgica o inusual al tamoxifeno, a otros medicamentos, alimentos, colorantes o conservantes -si est embarazada o buscando quedar embarazada -si est amamantando a un beb Cmo debo utilizar este medicamento? Tome este medicamento por va oral con un vaso de agua. Siga las instrucciones de la etiqueta del [place]. Este medicamento se puede tomar con o sin alimentos. Tome sus dosis a intervalos regulares. No utilice su medicamento con una frecuencia mayor a la indicada. No deje de tomarlo excepto si as lo indica su mdico. Su farmacutico le dar una [place] del medicamento especial con cada receta y relleno. Asegrese de leer esta informacin cada vez cuidadosamente. Hable con su pediatra para informarse acerca del uso de este medicamento en nios. Aunque este medicamento puede ser recetado para condiciones selectivas, las  precauciones se aplican. Sobredosis: Pngase en contacto inmediatamente con un centro toxicolgico o una sala de urgencia si usted cree que haya tomado demasiado medicamento. ATENCIN: [employer] es solo para usted. No comparta este medicamento con nadie. Qu sucede si me olvido de una dosis? Si olvida una dosis, tmela lo antes posible. Si es casi la hora de la prxima dosis, tome slo esa dosis. No tome dosis adicionales o dobles. Qu puede interactuar con este medicamento? -aminoglutemida -bromocriptina -medicamentos quimioteraputicos -hormonas femeninas, como estrgenos y pldoras anticonceptivas -letrozol -medroxiprogesterona -fenobarbital -rifampicina -warfarina Puede ser que esta lista no menciona todas las posibles interacciones. Informe a su profesional de [employer]	[place] de [employer] productos a base de hierbas, medicamentos de [place] o suplementos nutritivos que est tomando. Si usted fuma, consume bebidas alcohlicas o si utiliza drogas ilegales, indqueselo tambin a su profesional de [employer]	[place]. Algunas sustancias pueden interactuar con su medicamento. A qu debo estar atento al usar Coca[employer]Cola? Visite a su mdico o a su profesional de la salud para chequear su evolucin peridicamente. Deber hacerse exmenes de las mamas, pelvis y [place] en forma regular. Si est tomando Coca[employer]Cola para reducir el riesgo de Optician, dispensing de mama, debe saber que este medicamento no previene todos los tipos de cncer de mama. Si contrae cncer de mama u otro problema, no hay garantas de que se detecten en las primeras fases. No se debe quedar embarazada mientras toma este medicamento o durante 2 meses despus de suspender el tratamiento con el medicamento. Deje de tomar este medicamento si cree que puede haber quedado embarazada y consulte a su mdico. Este medicamento puede daar al beb sin nacer. Las mujeres que puedan quedar embarazadas debern usar mtodos  anticonceptivos que no utilicen hormonas durante el tratamiento  de tamoxifeno y durante 2 meses despus de suspender la terapia. Solicite asesoramiento sobre mtodos anticonceptivos a su proveedor de Geophysical data processor. No debe amamantar a un beb mientras est tomando este medicamento. Qu efectos secundarios puedo tener al [employer] este medicamento? Efectos secundarios que debe informar a su mdico o a Barrister's clerk de la salud tan pronto como sea posible: -cambios en la visin (visin borrosa) -cambios en su ciclo menstrual -dificultad al respirar o falta de aliento -dificultad para caminar o hablar -nuevos bultos en las mamas -entumecimiento -presin o dolor plvico -enrojecimiento, formacin de ampollas, descamacin o distensin de la piel, inclusive dentro de la boca -erupcin cutnea o picazn (urticarias) -dolor repentino en el pecho -hinchazn de los labios, la cara o la lengua -hinchazn, dolor o sensibilidad en las pantorrillas o en las piernas -magulladuras o sangrado -flujo vaginal de color marrn, ladrillo o con sangre -debilidad -color amarillento de los ojos o la piel Efectos secundarios que, por lo general, no requieren atencin mdica (debe informarlos a su mdico o a su profesional de la salud si persisten o si son molestos): -fatiga -cada del cabello, aunque es poco comn y generalmente leve -dolor de cabeza -sofocos -impotencia (en los hombres) -nuseas, vmito (leve) -secreciones vaginales (blanca o clara) Puede ser que esta lista no menciona todos los posibles efectos secundarios. Comunquese a su mdico por asesoramiento mdico [employer] Usted puede informar los efectos secundarios a la FDA por telfono al 1-800-FDA-1088. Dnde debo guardar mi medicina? Mantngala fuera del alcance de los nios. Gurdela a [employer], entre 20 y 97 grados C (30 y 47 grados F). Protjala de la luz. Mantenga el envase bien cerrado. Deseche los  medicamentos que no haya utilizado, despus de la fecha de vencimiento. ATENCIN: Este folleto es un resumen. Puede ser que no cubra toda la posible informacin. Si usted tiene preguntas acerca de esta medicina, consulte con su mdico, su farmacutico o su profesional de Technical sales engineer.    2016, Elsevier/Gold Standard. (2014-09-09 00:00:00)  Aspirin daily

## 2016-01-21 NOTE — Progress Notes (Signed)
Patient ID: Diane Bates, female   DOB: Jul 08, 1971, 45 y.o.   MRN: [id number]  Chief Complaint  Patient presents with  . Routine Post Op    HPI Diane Bates is a 45 y.o. female.  Here today for postoperative visit, left breast mass excision. She states she is getting along well. Minimal to no pain, using tylenol as needed.  She is here Today with her daughter, Treasa School and husband, Antoine Poche  Interpreter, Johnsie Cancel present for interview, exam and discussion.  I personally reviewed the history.  HPI  No past medical history on file.  Past Surgical History  Procedure Laterality Date  . Cholecystectomy  2000  . Breast biopsy Left 12/14/2015    stereo, atypical hyperplasia  . Breast excisional biopsy Left 01/14/2016  . Breast biopsy Left 01/14/2016    Procedure: BREAST BIOPSY;  Surgeon: [name], MD;  Location: [facility] ORS;  Service: General;  Laterality: Left;    Family History  Problem Relation Age of Onset  . Breast cancer Neg Hx     Social History Social History  Substance Use Topics  . Smoking status: Never Smoker   . Smokeless tobacco: Never Used  . Alcohol Use: No    No Known Allergies  Current Outpatient Prescriptions  Medication Sig Dispense Refill  . acetaminophen (TYLENOL) 500 MG tablet Take 500 mg by mouth every 6 (six) hours as needed.    . tamoxifen (NOLVADEX) 20 MG tablet Take 1 tablet (20 mg total) by mouth daily. 30 tablet 0   No current facility-administered medications for this visit.    Review of Systems Review of Systems  Constitutional: Negative.   Respiratory: Negative.   Cardiovascular: Negative.     Blood pressure 122/76, height 5' (1.524 m), weight 163 lb (73.936 kg), last menstrual period 01/12/2016.  Physical Exam Physical Exam  Constitutional: She is oriented to person, place, and time. She appears well-developed and well-nourished.  Pulmonary/Chest:    Left breast incision healing well.   Neurological: She is alert and oriented to person, place, and time.  Skin: Skin is warm and dry.  Psychiatric: Her behavior is normal.    Data Reviewed 01/14/2016 results:  A. BREAST, LEFT; WIDE EXCISION:  - RESIDUAL STROMAL MUCIN POOL AND CALCIFICATION, 1 MM, ADJACENT TO  BIOPSY SITE.  - BIOPSY SITE CHANGES AND MARKER CLIP PRESENT.  - NEGATIVE FOR RESIDUAL ATYPIA, AND NEGATIVE FOR MALIGNANCY.  12/14/2015 results: A. 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  - MULTIPLE DEEPER LEVELS WERE EXAMINED. Assessment    Doing well status post left breast reexcision for ADH.        Plan    Indication for antiestrogen therapy to minimize the risk of regression from ADH to malignancy was reviewed.      Discussed risk, benefits and side effects of antihormonal therapy including those related to DVT and vasomotor symptoms. The patient is still having regular menses and is felt to be at no risk for uterine cancer at this time.  Recommend aspirin 81 mg daily with the tamoxifen to minimize the risk for deep venous thrombosis.   Patient will be asked to return to the office in one month.  95% of today's visit was spent reviewing the indications for antiestrogen therapy.   PCP:  Sabino Snipes This information has been scribed by Karie Fetch RN, BSN,BC.    [name] 01/21/2016, 9:05 PM

## 2016-02-12 ENCOUNTER — Encounter: Payer: Self-pay | Admitting: *Deleted

## 2016-02-12 NOTE — Progress Notes (Signed)
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  Next mammogram will be due in one year unless otherwise indicated by Dr. Bary Castilla.  HSIS to Bertha.

## 2016-02-23 ENCOUNTER — Ambulatory Visit (INDEPENDENT_AMBULATORY_CARE_PROVIDER_SITE_OTHER): Payer: PRIVATE HEALTH INSURANCE | Admitting: General Surgery

## 2016-02-23 ENCOUNTER — Telehealth: Payer: Self-pay | Admitting: *Deleted

## 2016-02-23 VITALS — BP 122/80 | HR 78 | Resp 12 | Ht 60.0 in | Wt 158.0 lb

## 2016-02-23 DIAGNOSIS — N62 Hypertrophy of breast: Secondary | ICD-10-CM

## 2016-02-23 DIAGNOSIS — N6092 Unspecified benign mammary dysplasia of left breast: Secondary | ICD-10-CM

## 2016-02-23 MED ORDER — TAMOXIFEN CITRATE 20 MG PO TABS: 20.0000 mg | ORAL_TABLET | Freq: Every day | ORAL | 4 refills | Status: DC

## 2016-02-23 NOTE — Progress Notes (Signed)
Patient ID: Diane Bates, female   DOB: 07/24/1971, 45 y.o.   MRN: [id number]  Chief Complaint  Patient presents with  . Routine Post Op    left breast excision    HPI Diane Bates is a 45 y.o. female here today for postoperative visit, left breast mass excision done on 01/14/16. She states she is getting along well. Patient states she is doing well on Tamoxifen.  Interpreter Loyda present.                                                        Marland KitchenHPI  No past medical history on file.  Past Surgical History:  Procedure Laterality Date  . BREAST BIOPSY Left 12/14/2015   stereo, atypical hyperplasia  . BREAST BIOPSY Left 01/14/2016   Procedure: BREAST BIOPSY;  Surgeon: [name], MD;  Location: [facility] ORS;  Service: General;  Laterality: Left;  . BREAST EXCISIONAL BIOPSY Left 01/14/2016  . CHOLECYSTECTOMY  2000    Family History  Problem Relation Age of Onset  . Breast cancer Neg Hx     Social History Social History  Substance Use Topics  . Smoking status: Never Smoker  . Smokeless tobacco: Never Used  . Alcohol use No    No Known Allergies  Current Outpatient Prescriptions  Medication Sig Dispense Refill  . acetaminophen (TYLENOL) 500 MG tablet Take 500 mg by mouth every 6 (six) hours as needed.    . tamoxifen (NOLVADEX) 20 MG tablet Take 1 tablet (20 mg total) by mouth daily. 90 tablet 4   No current facility-administered medications for this visit.     Review of Systems Review of Systems  Constitutional: Negative.   Respiratory: Negative.   Cardiovascular: Negative.     Blood pressure 122/80, pulse 78, resp. rate 12, height 5' (1.524 m), weight 158 lb (71.7 kg).  Physical Exam Physical Exam  Constitutional: She is oriented to person, place, and time. She appears well-developed and well-nourished.  Pulmonary/Chest:    Left breast excision is well healed.   Neurological: She is alert and oriented to person, place, and time.  Skin: Skin is warm  and dry.    Data Reviewed No new data  Assessment    ADH, tolerating Tamoxifen therapy    Plan    Reviewed the importance of reporting acute leg swelling that might suggest DVT promptly.  The patient will be contacted and encouraged to make use of a pediatric aspirin tablet daily to minimize lately aggregation.  She reported that she was being charge $30 per 30 pills at her present pharmacy. This is likely available a lower cost at [facility].    The patient has been asked to return to the office in 10 months with a bilateral diagnostic mammogram. Sent new Rx for Tamoxifen (20 mg, #90, for refills) to [facility]   PCP:  Sabino Snipes This information has been scribed by Gaspar Cola CMA. [name] 02/23/2016, 12:55 PM

## 2016-02-23 NOTE — Telephone Encounter (Signed)
-----   Message from [name], MD sent at 02/23/2016 12:58 PM EDT ----- Have the translator service ask the patient to use a baby aspirin daily. Thanks.

## 2016-02-23 NOTE — Telephone Encounter (Signed)
Interpreter Loyda called patient stated to take a baby aspirin daily.

## 2016-02-23 NOTE — Patient Instructions (Signed)
The patient has been asked to return to the office in 10 months with a bilateral diagnostic mammogram.

## 2016-09-06 ENCOUNTER — Ambulatory Visit (INDEPENDENT_AMBULATORY_CARE_PROVIDER_SITE_OTHER): Payer: PRIVATE HEALTH INSURANCE | Admitting: General Surgery

## 2016-09-06 ENCOUNTER — Encounter: Payer: Self-pay | Admitting: General Surgery

## 2016-09-06 VITALS — BP 148/82 | HR 88 | Resp 18 | Ht 60.0 in | Wt 155.0 lb

## 2016-09-06 DIAGNOSIS — N644 Mastodynia: Secondary | ICD-10-CM | POA: Diagnosis not present

## 2016-09-06 DIAGNOSIS — R0789 Other chest pain: Secondary | ICD-10-CM

## 2016-09-06 NOTE — Patient Instructions (Addendum)
The patient is aware to call back for any questions or concerns. Follow up as scheduled in April. Proper lifting techniques reviewed. Monitor pain in relationship to caring for grandchild.

## 2016-09-06 NOTE — Progress Notes (Addendum)
Patient ID: Diane Bates, female   DOB: 28-Jan-1971, 46 y.o.   MRN: [id number]  Chief Complaint  Patient presents with  . Breast Problem    left breast pain    HPI Diane Bates is a 46 y.o. female.  who presents for a breast evaluation. The most recent mammogram was done on 12-21-15 . Left breast ultrasound was 12-30-15. Left breast biopsy was 01-14-16, showing atypical hyerplasia.  She is here today for some left breast pain. She states the pain 2 weeks ago was lateral left breast with swelling. She states for about one month the pain left inner breast. She is not having any further lateral pain or breast pain. Denies any breast injury or trauma. The pain comes and goes, lasting briefly. She notices the pain especially when she went to to pick up her grandchild. She is keeping her grandchild that weighs 20 pounds. She is tolerating the tamoxifen. Patient does perform regular self breast checks and gets regular mammograms done.    Interpreter, Ronnald Collum, present for interview, exam and discussion.   HPI  No past medical history on file.  Past Surgical History:  Procedure Laterality Date  . BREAST BIOPSY Left 12/14/2015   stereo, atypical hyperplasia  . BREAST BIOPSY Left 01/14/2016   Procedure: BREAST BIOPSY;  Surgeon: [name], MD;  Location: [facility] ORS;  Service: General;  Laterality: Left;  . BREAST EXCISIONAL BIOPSY Left 01/14/2016  . CHOLECYSTECTOMY  2000    Family History  Problem Relation Age of Onset  . Breast cancer Neg Hx     Social History Social History  Substance Use Topics  . Smoking status: Never Smoker  . Smokeless tobacco: Never Used  . Alcohol use No    No Known Allergies  Current Outpatient Prescriptions  Medication Sig Dispense Refill  . acetaminophen (TYLENOL) 500 MG tablet Take 500 mg by mouth every 6 (six) hours as needed.    . tamoxifen (NOLVADEX) 20 MG tablet Take 1 tablet (20 mg total) by mouth daily. 90 tablet 4   No current  facility-administered medications for this visit.     Review of Systems Review of Systems  Constitutional: Negative.   Respiratory: Negative.   Cardiovascular: Negative.     Blood pressure (!) 148/82, pulse 88, resp. rate 18, height 5' (1.524 m), weight 155 lb (70.3 kg), last menstrual period 09/30/2015.  Physical Exam Physical Exam  Constitutional: She is oriented to person, place, and time. She appears well-developed and well-nourished.  HENT:  Mouth/Throat: Oropharynx is clear and moist.  Eyes: Conjunctivae are normal. No scleral icterus.  Neck: Neck supple.  Cardiovascular: Normal rate, regular rhythm and normal heart sounds.   Pulmonary/Chest: Effort normal and breath sounds normal. Right breast exhibits no inverted nipple, no mass, no nipple discharge, no skin change and no tenderness. Left breast exhibits no inverted nipple, no mass, no nipple discharge, no skin change and no tenderness.    Lymphadenopathy:    She has no cervical adenopathy.    She has no axillary adenopathy.  Neurological: She is alert and oriented to person, place, and time.  Skin: Skin is warm and dry.  Psychiatric: She has a normal mood and affect. Her behavior is normal.      Assessment    Transient breast pain, likely musculoskeletal in origin. No new breast pathology on clinical exam.    Plan         Follow up as scheduled in April. Proper lifting techniques reviewed. Monitor pain in  relationship to caring for grandchild.   This information has been scribed by Karie Fetch RN, BSN,BC.   [name] 09/15/2016, 3:15 PM

## 2016-09-07 DIAGNOSIS — N644 Mastodynia: Secondary | ICD-10-CM | POA: Insufficient documentation

## 2016-11-02 ENCOUNTER — Encounter (INDEPENDENT_AMBULATORY_CARE_PROVIDER_SITE_OTHER): Payer: Self-pay

## 2016-11-02 ENCOUNTER — Ambulatory Visit
Admission: RE | Admit: 2016-11-02 | Discharge: 2016-11-02 | Disposition: A | Discharge status: Home / Self Care | Payer: Self-pay | Source: Ambulatory Visit | Attending: Oncology | Admitting: Oncology

## 2016-11-02 ENCOUNTER — Ambulatory Visit: Payer: Self-pay | Attending: Oncology

## 2016-11-02 VITALS — BP 116/76 | HR 75 | Temp 94.7°F | Resp 18 | Ht 60.0 in | Wt 154.8 lb

## 2016-11-02 DIAGNOSIS — N63 Unspecified lump in unspecified breast: Secondary | ICD-10-CM

## 2016-11-02 NOTE — Progress Notes (Signed)
Subjective:     Patient ID: Bettina Gavia, female   DOB: 29-Oct-1970, 46 y.o.   MRN: [id number]  HPI   Review of Systems     Objective:   Physical Exam  Pulmonary/Chest: Right breast exhibits no inverted nipple, no mass, no nipple discharge, no skin change and no tenderness. Left breast exhibits no inverted nipple, no mass, no nipple discharge, no skin change and no tenderness. Breasts are symmetrical.         Assessment:     46 year old hispanic patient returns for annual follow-up of left breast mass which was biopsied in June 2017 path results of atypical ductal hyperplasia.  She is being followed by Dr. Bary Castilla, and has an appointment on 11/09/16.  Patient is taking Tamoxifen.  Patient screened, and meets [facility] eligibility.  Patient does not have insurance, Medicare or Medicaid.  Handout given on Affordable Care Act.  Instructed patient on breast self-exam using teach back method.  CBE unremarkable.  No mass or lump palpated.  Per [facility], patient had a normal pap in 2016.  [name] requested pap report to be scanned in to chart. Jaqui Laukaitis interpreted exam.    Plan:     Sent for bilateral screening mammogram.

## 2016-11-09 ENCOUNTER — Ambulatory Visit (INDEPENDENT_AMBULATORY_CARE_PROVIDER_SITE_OTHER): Payer: PRIVATE HEALTH INSURANCE | Admitting: General Surgery

## 2016-11-09 ENCOUNTER — Encounter: Payer: Self-pay | Admitting: General Surgery

## 2016-11-09 VITALS — BP 126/78 | HR 80 | Resp 12 | Ht 60.0 in | Wt 154.0 lb

## 2016-11-09 DIAGNOSIS — N6092 Unspecified benign mammary dysplasia of left breast: Secondary | ICD-10-CM

## 2016-11-09 NOTE — Patient Instructions (Signed)
The patient has been asked to return to the office in one year with a bilateral diagnostic mammogram. 

## 2016-11-09 NOTE — Progress Notes (Signed)
Patient ID: Diane Bates, female   DOB: [birth year], 46 y.o.   MRN: [id number]  Chief Complaint  Patient presents with  . Follow-up    HPI Diane Bates is a 46 y.o. female who presents for a breast evaluation. The most recent mammogram was done on 11/02/2016. Soreness with movement left breast.Patient does perform regular self breast checks and gets regular mammograms done. Doing well on Tamoxifen. Interpreter Ronnald Collum is present at visit.  HPI  No past medical history on file.  Past Surgical History:  Procedure Laterality Date  . BREAST BIOPSY Left 12/14/2015   stereo, atypical hyperplasia  . BREAST BIOPSY Left 01/14/2016   Procedure: BREAST BIOPSY;  Surgeon: [name], MD;  Location: [facility] ORS;  Service: General;  Laterality: Left;  . BREAST EXCISIONAL BIOPSY Left 01/14/2016   ADH  . CHOLECYSTECTOMY  2000    Family History  Problem Relation Age of Onset  . Breast cancer Neg Hx     Social History Social History  Substance Use Topics  . Smoking status: Never Smoker  . Smokeless tobacco: Never Used  . Alcohol use No    No Known Allergies  Current Outpatient Prescriptions  Medication Sig Dispense Refill  . acetaminophen (TYLENOL) 500 MG tablet Take 500 mg by mouth every 6 (six) hours as needed.    . tamoxifen (NOLVADEX) 20 MG tablet Take 1 tablet (20 mg total) by mouth daily. 90 tablet 4   No current facility-administered medications for this visit.     Review of Systems Review of Systems  Blood pressure 126/78, pulse 80, resp. rate 12, height 5' (1.524 m), weight 154 lb (69.9 kg), last menstrual period 01/12/2016.  Physical Exam Physical Exam  Constitutional: She is oriented to person, place, and time. She appears well-developed and well-nourished.  Eyes: Conjunctivae are normal. No scleral icterus.  Neck: Neck supple.  Cardiovascular: Normal rate, regular rhythm and normal heart sounds.   Pulmonary/Chest: Effort normal and breath sounds normal.  Right breast exhibits no inverted nipple, no mass, no nipple discharge, no skin change and no tenderness. Left breast exhibits no inverted nipple, no mass, no nipple discharge, no skin change and no tenderness.  Left breast well healed incision from 12 to 2 .   Lymphadenopathy:    She has no cervical adenopathy.    She has no axillary adenopathy.  Neurological: She is alert and oriented to person, place, and time.  Skin: Skin is warm and dry.    Data Reviewed 12/14/2015 core biopsy: DIAGNOSIS:  A. 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  - MULTIPLE DEEPER LEVELS WERE EXAMINED.    01/14/2016 wide excision: DIAGNOSIS:  A. BREAST, LEFT; WIDE EXCISION:  - RESIDUAL STROMAL MUCIN POOL AND CALCIFICATION, 1 MM, ADJACENT TO  BIOPSY SITE.  - BIOPSY SITE CHANGES AND MARKER CLIP PRESENT.  - NEGATIVE FOR RESIDUAL ATYPIA, AND NEGATIVE FOR MALIGNANCY.   11/02/2016 bilateral mammograms reviewed: RECOMMENDATION: Screening mammogram in one year.(Code:SM-B-01Y)  I have discussed the findings and recommendations with the patient through the use of an interpreter. Results were also provided in writing at the conclusion of the visit. If applicable, a reminder letter will be sent to the patient regarding the next appointment.  BI-RADS CATEGORY  2: Benign. Assessment Atypical ductal hyperplasia, tolerating chemotherapy prevention well.   Plan    Follow-up bilateral screening mammogram as recommended by radiology in one year.     The patient has  been asked to return to the office in one year with a bilateral screening mammogram.  HPI, Physical Exam, Assessment and Plan have been scribed under the direction and in the presence of Hervey Ard, MD.  Gaspar Cola, CMA  I have completed the exam and reviewed the above documentation for accuracy and completeness.  I agree with the above.  Haematologist has  been used and any errors in dictation or transcription are unintentional.  Hervey Ard, M.D., F.A.C.S.  [name] 11/09/2016, 11:07 AM

## 2016-11-23 NOTE — Progress Notes (Signed)
Letter mailed from [facility] to notify of normal mammogram results.  Patient to return in one year for annual screening mammogram .  Dr. Bary Castilla to follow patient after screening mammogram per his note.  Copy to [facility].

## 2017-02-16 ENCOUNTER — Encounter: Payer: Self-pay | Admitting: Family Medicine

## 2017-03-07 ENCOUNTER — Other Ambulatory Visit: Payer: Self-pay | Admitting: General Surgery

## 2017-05-27 IMAGING — MG MM DIGITAL DIAGNOSTIC UNILAT*L*
4 series · 4 of 4 positions shown · non-contrast
Comparison: Previous exam(s).

CLINICAL DATA: Screening recall for left breast calcifications.

EXAM:
DIGITAL DIAGNOSTIC LEFT MAMMOGRAM WITH CAD

[L ML (1 of 3)]
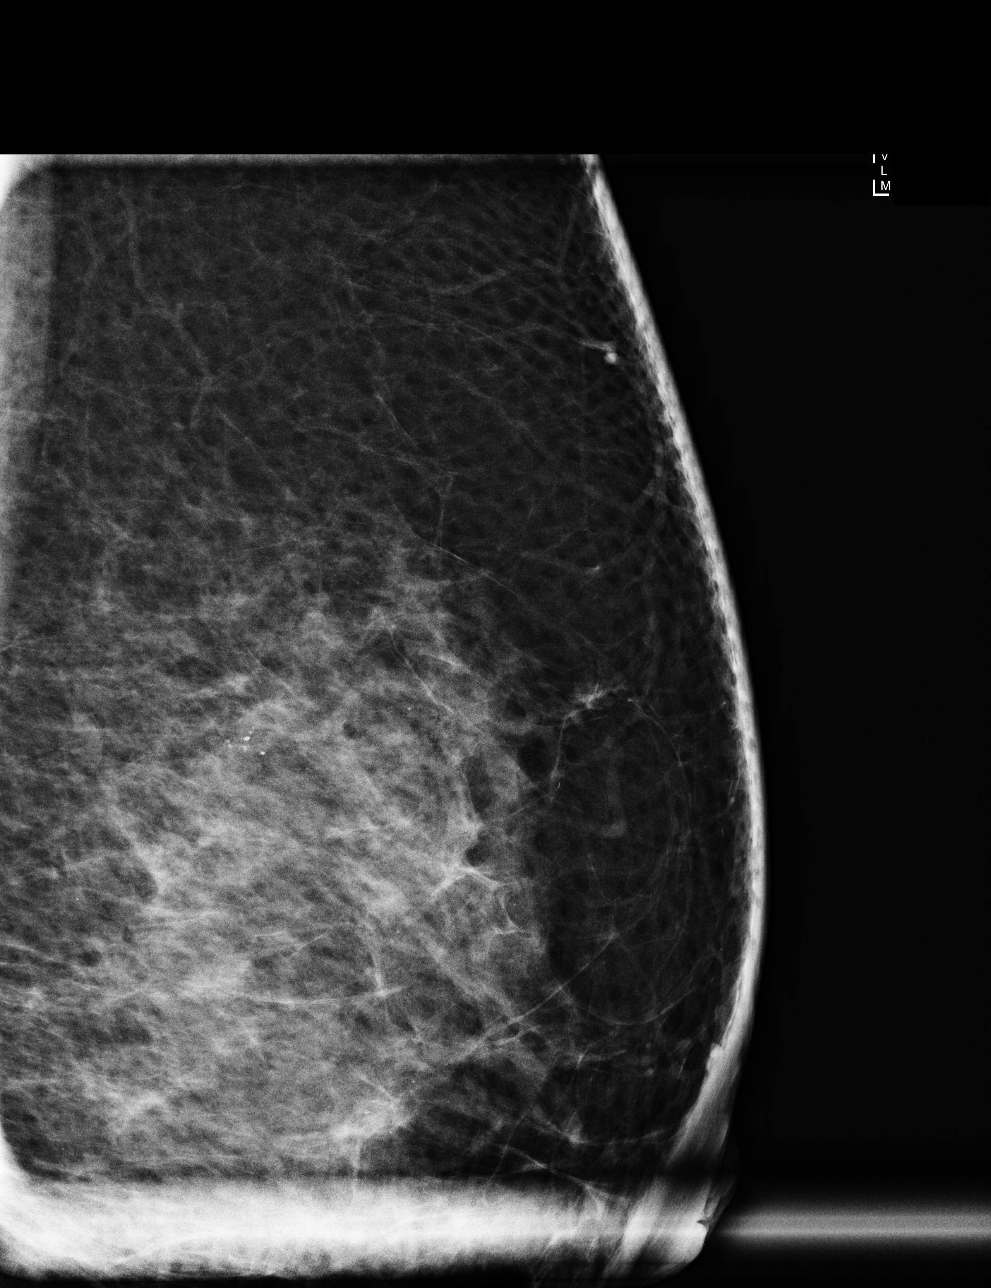

[L ML (2 of 3)]
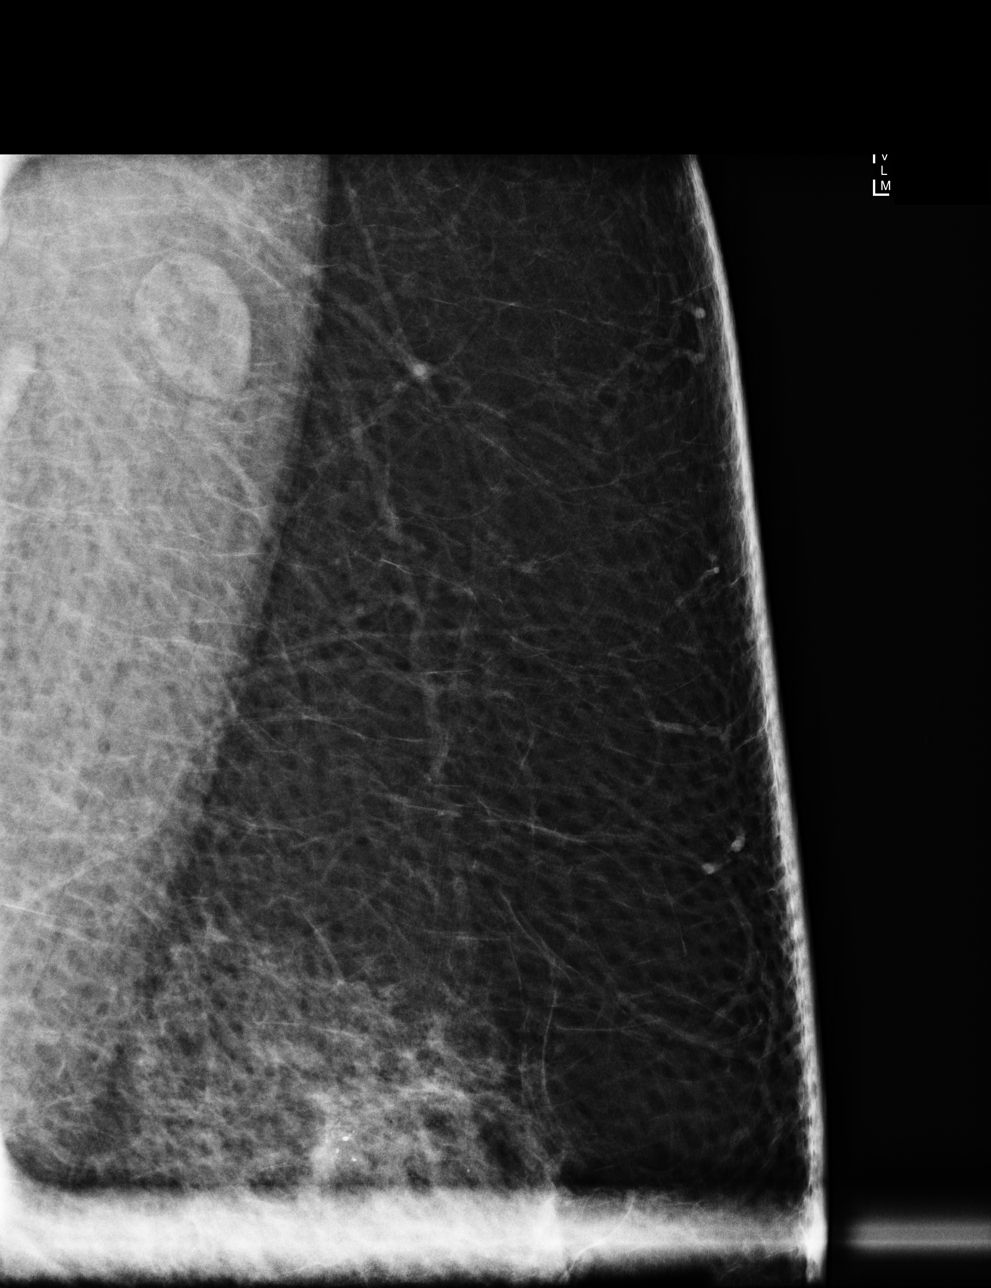

[L ML (3 of 3)]
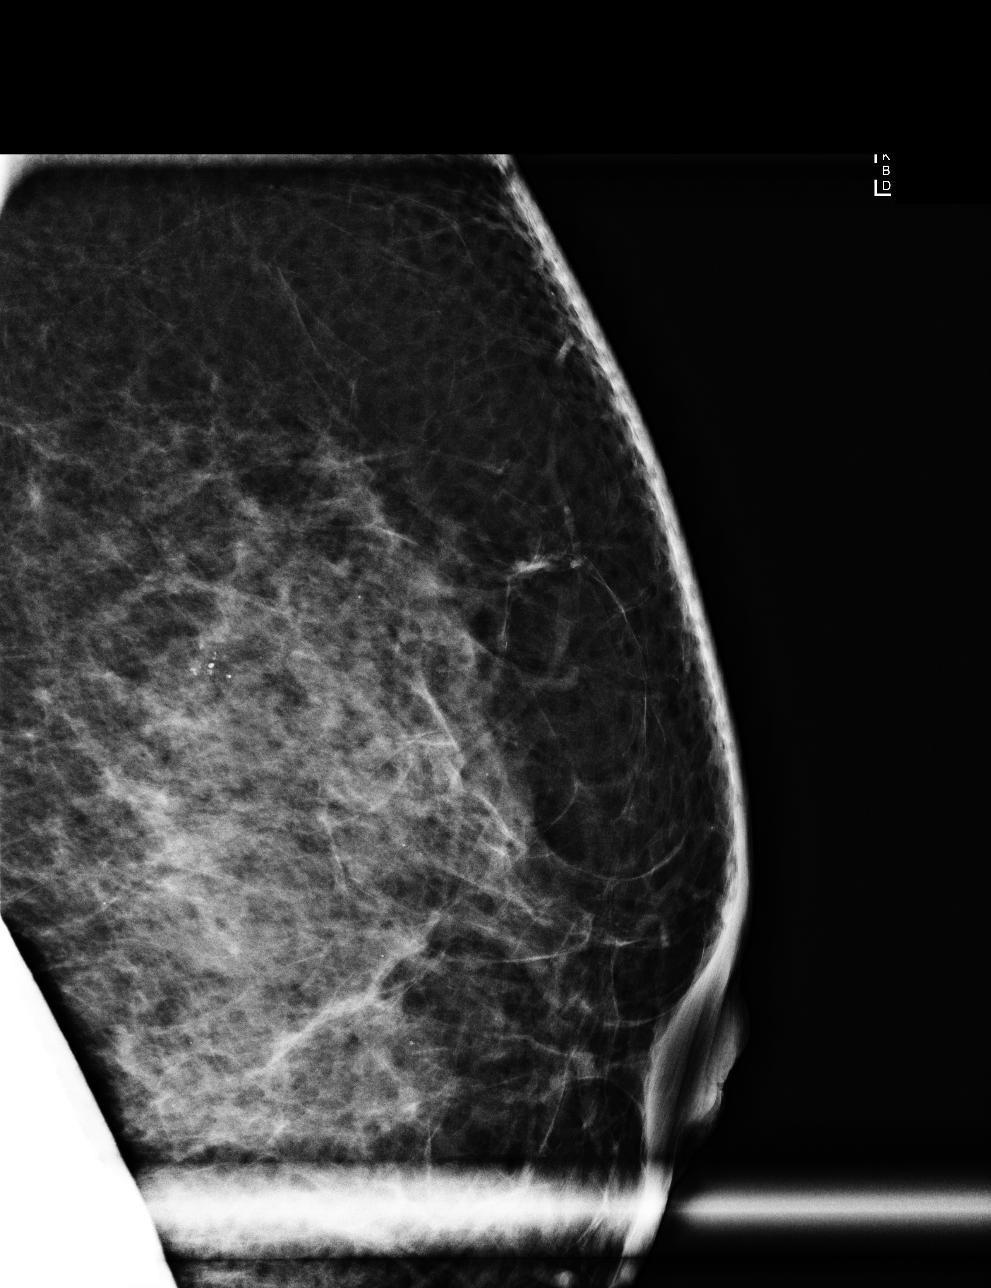

[L CC]
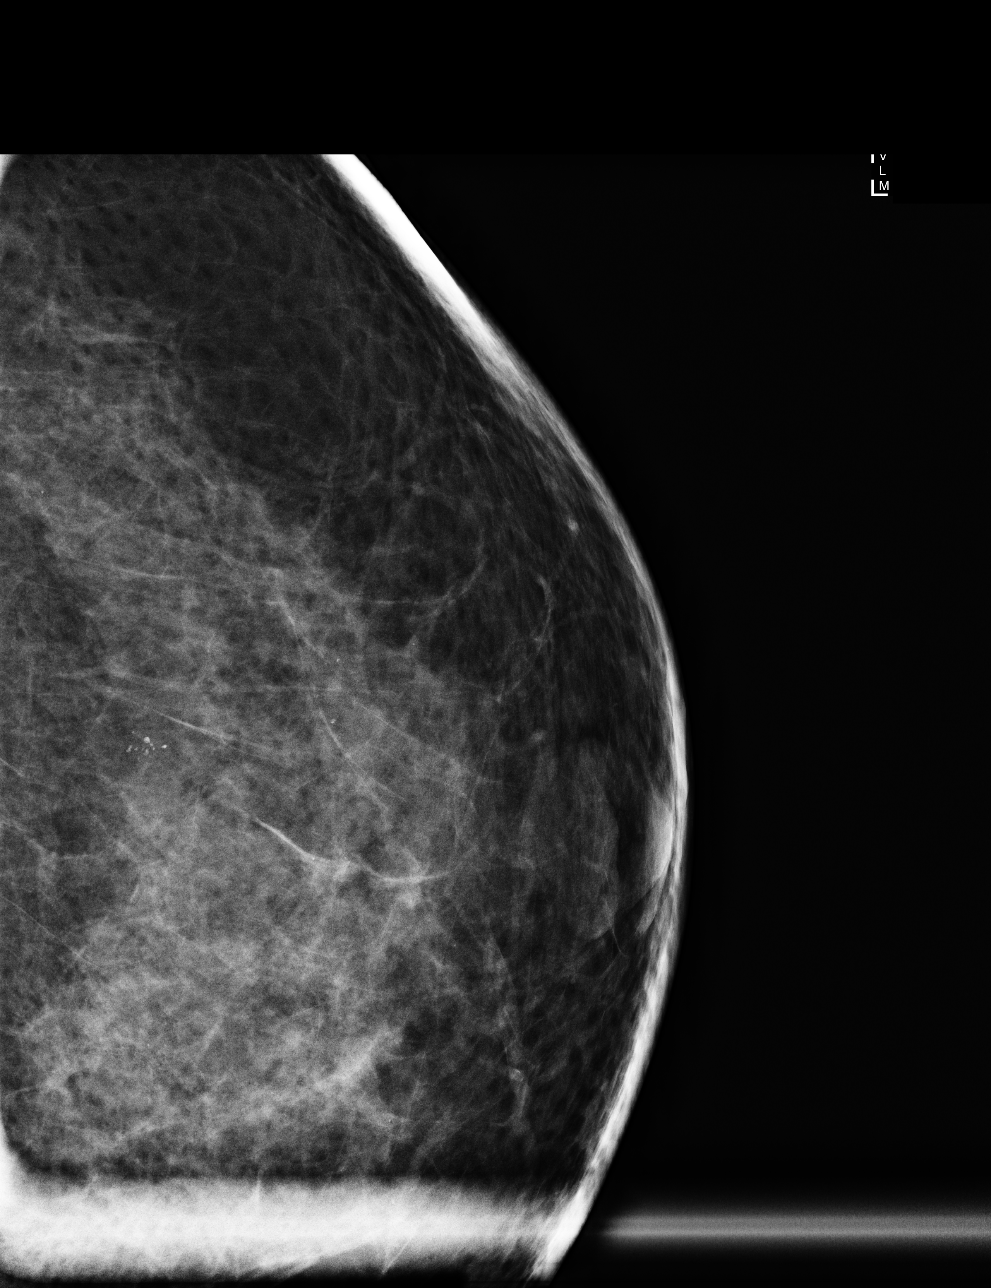

[4 of 4 positions shown; findings below may reference images not displayed]

ACR Breast Density Category c: The breast tissue is heterogeneously
dense, which may obscure small masses.
FINDINGS: In the superior left breast at approximately 12 o'clock, middle to
posterior depth there is a 4 mm group of coarse somewhat
heterogeneous calcifications. These do not appear to layer on the
true lateral view.

Mammographic images were processed with CAD.
IMPRESSION: Indeterminate left breast calcifications.

RECOMMENDATION:
Stereotactic biopsy is recommended for the left breast
calcifications.

I have discussed the findings and recommendations with the patient
with the assistance of an interpreter. Results were also provided in
writing at the conclusion of the visit. If applicable, a reminder
letter will be sent to the patient regarding the next appointment.

BI-RADS CATEGORY  4: Suspicious.

## 2017-06-06 IMAGING — MG MM DIGITAL DIAGNOSTIC UNILAT*L*
2 series · 2 of 2 positions shown · non-contrast
Comparison: Previous exam(s).

CLINICAL DATA: Post left breast stereotactic biopsy.

EXAM:
DIAGNOSTIC LEFT MAMMOGRAM POST STEREOTACTIC BIOPSY

[L ML]
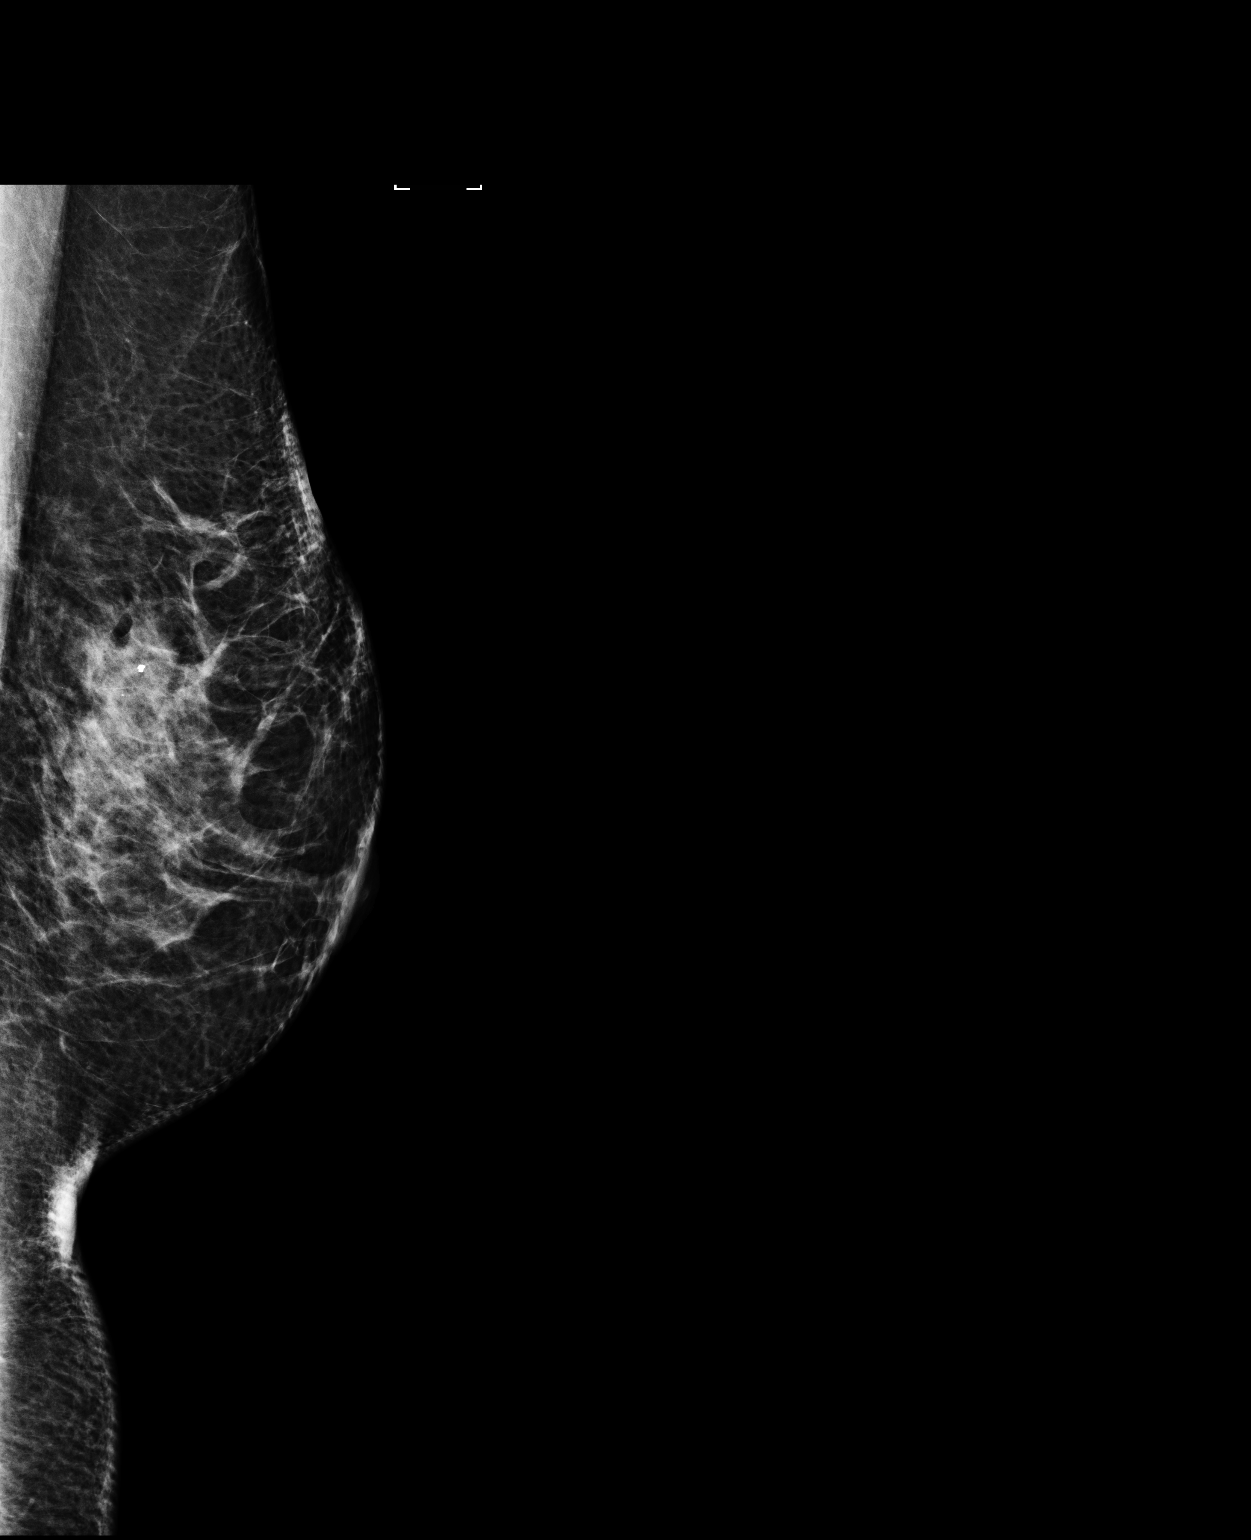

[L CC]
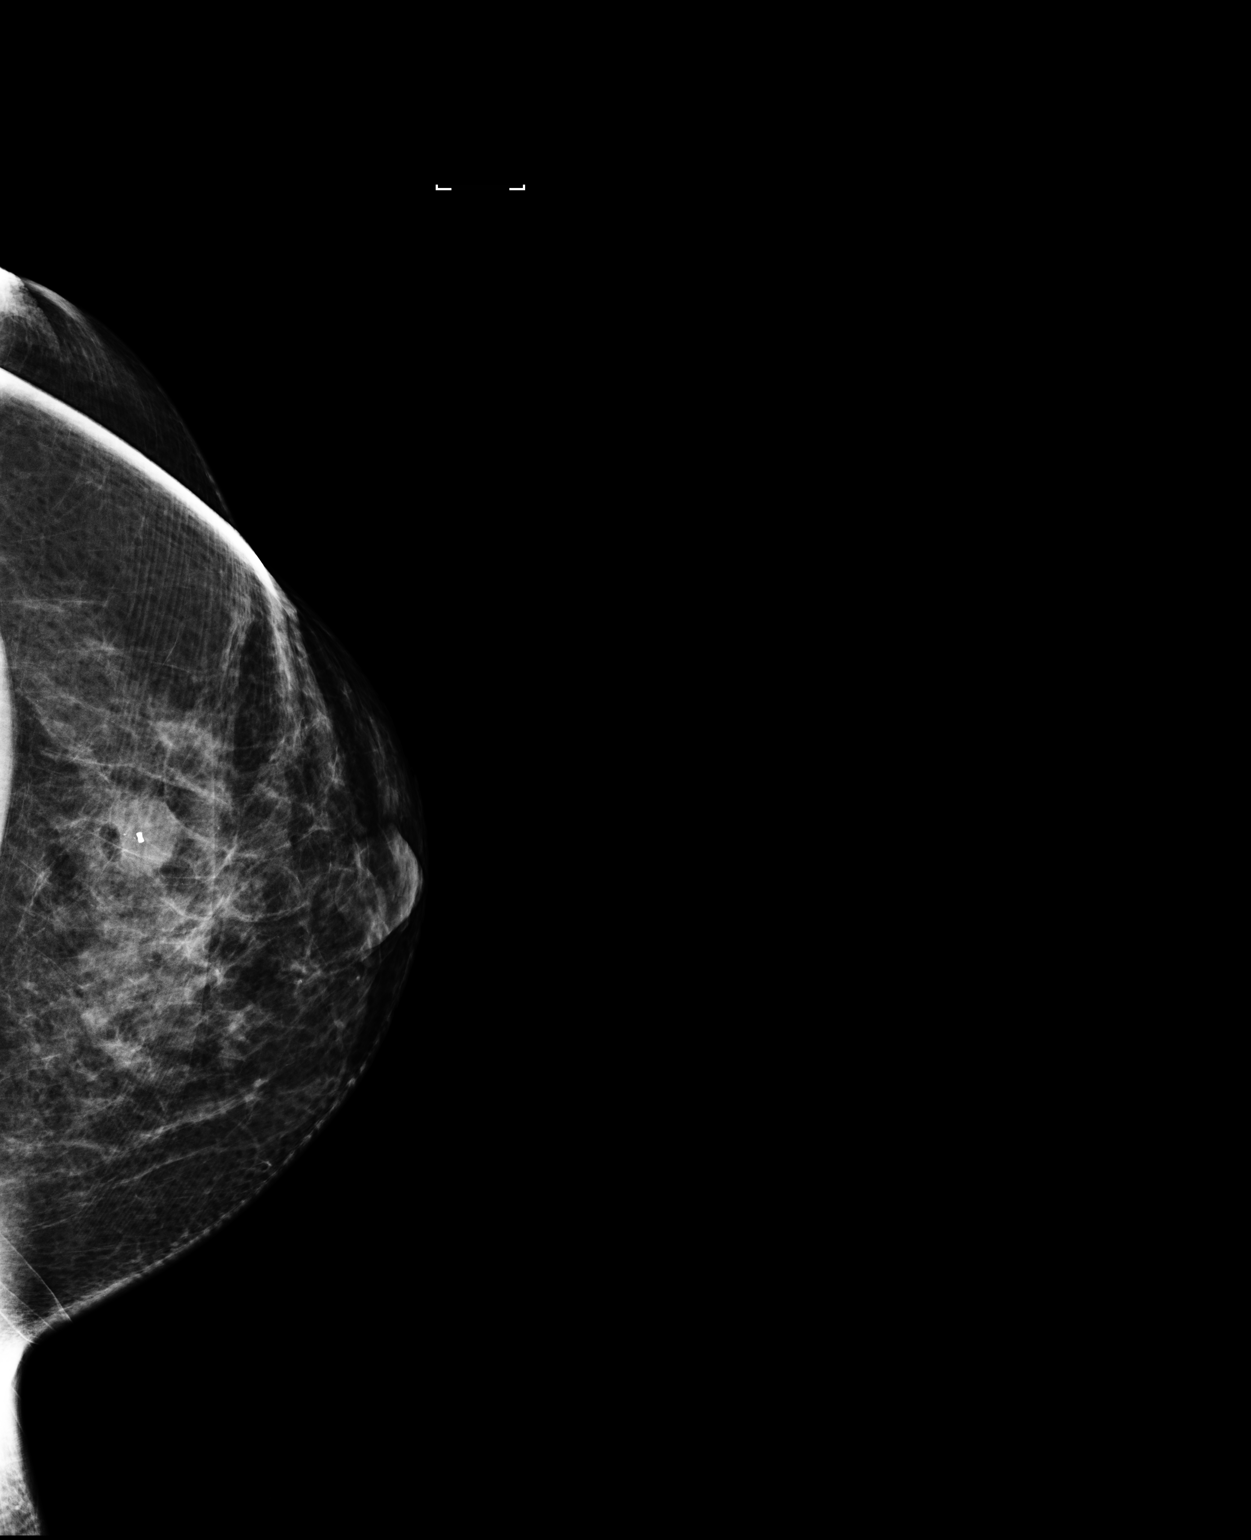

[2 of 2 positions shown; findings below may reference images not displayed]

FINDINGS: Mammographic images were obtained following stereotactic guided
biopsy of the small group of left breast calcifications. The top hat
shaped clip is in appropriate position.
IMPRESSION: Appropriate positioning of clip following left breast stereotactic
biopsy.

Final Assessment: Post Procedure Mammograms for Marker Placement

## 2017-12-11 ENCOUNTER — Encounter (INDEPENDENT_AMBULATORY_CARE_PROVIDER_SITE_OTHER): Payer: Self-pay

## 2017-12-11 ENCOUNTER — Ambulatory Visit
Admission: RE | Admit: 2017-12-11 | Discharge: 2017-12-11 | Disposition: A | Discharge status: Home / Self Care | Payer: Self-pay | Source: Ambulatory Visit | Attending: Oncology | Admitting: Oncology

## 2017-12-11 ENCOUNTER — Ambulatory Visit: Payer: Self-pay | Attending: Oncology

## 2017-12-11 VITALS — Ht 60.0 in | Wt 151.0 lb

## 2017-12-11 DIAGNOSIS — Z0000 Encounter for general adult medical examination without abnormal findings: Secondary | ICD-10-CM

## 2017-12-11 NOTE — Progress Notes (Signed)
  Subjective:     Patient ID: Diane Bates, female   DOB: Dec 28, 1970, 47 y.o.   MRN: [id number]  HPI   Review of Systems     Objective:   Physical Exam  Pulmonary/Chest: Right breast exhibits no inverted nipple, no mass, no nipple discharge, no skin change and no tenderness. Left breast exhibits no inverted nipple, no mass, no nipple discharge, no skin change and no tenderness. Breasts are asymmetrical.  Asymmetry from left breast biopsy    Genitourinary: No labial fusion. There is no rash, tenderness, lesion or injury on the right labia. There is no rash, tenderness, lesion or injury on the left labia. Uterus is not deviated, not enlarged, not fixed and not tender. Cervix exhibits friability. Cervix exhibits no motion tenderness and no discharge. Right adnexum displays no mass, no tenderness and no fullness. Left adnexum displays no mass, no tenderness and no fullness. No erythema, tenderness or bleeding in the vagina. No foreign body in the vagina. No signs of injury around the vagina. No vaginal discharge found.       Assessment:     47 year old hispanic patient presents for [facility] clinic visit.  Diane Bates interpreted exam.  Patient screened, and meets B[facility]CP eligibility.  Patient does not have insurance, Medicare or Medicaid.  Handout given on Affordable Care Act.  Instructed patient on breast self awareness using teach back method.   Patient had an excisional left breast biopsy in 2017.  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  Dr. Bary Bates placed patient on Tamoxifen for 5 years.  Clinical breast exam unremarkable.  No mass or lump palpated.  Pelvic exam normal.  Scant bleeding with pap collection.    Plan:     Sent for bilateral screening mammogram. Specimen collected for pap.  Diane Bates to schedule annual follow-up with Dr. Bary Bates.

## 2017-12-11 NOTE — Progress Notes (Signed)
Sent for bilateral screening mammogram.  Specimen collected for pap.  Letter mailed from [facility] to notify of normal mammogram results.  Patient to return in one year for annual screening.  Pap results pending.

## 2017-12-15 LAB — PAP LB AND HPV HIGH-RISK
HPV, high-risk: NEGATIVE
PAP Smear Comment: 0

## 2018-01-04 NOTE — Progress Notes (Signed)
Pap results negative for malignancy/negative HPV , but presence of endometrial cells in postmenopausal woman.  Discussed with Dr. Fredric Dine, and patient  referred to Dr. Marcelline Mates at [facility] on 02/02/18 at 8:00 .  Explained results, and referral to patient.  Also explained to patient ,and Kia at [facility] that this physician visit would not be paid for through [facility].   Kia went over self-pay expectations with patient.  Also confirmed follow-up visit with Dr. Bary Castilla on 01/16/18 at 9:30.  Jaqui Laukaitis interpreted conference phone call.

## 2018-01-16 ENCOUNTER — Ambulatory Visit (INDEPENDENT_AMBULATORY_CARE_PROVIDER_SITE_OTHER): Payer: Self-pay | Admitting: General Surgery

## 2018-01-16 ENCOUNTER — Encounter: Payer: Self-pay | Admitting: General Surgery

## 2018-01-16 VITALS — BP 142/82 | HR 71 | Resp 14 | Ht 60.0 in | Wt 149.0 lb

## 2018-01-16 DIAGNOSIS — N6092 Unspecified benign mammary dysplasia of left breast: Secondary | ICD-10-CM

## 2018-01-16 NOTE — Progress Notes (Signed)
Patient ID: Diane Bates, female   DOB: 1970-10-04, 47 y.o.   MRN: [id number]  Chief Complaint  Patient presents with  . Follow-up    mammogram    HPI Diane Bates is a 47 y.o. female who presents for a breast evaluation. The most recent mammogram was done on 12/11/17. Patient does perform regular self breast checks and gets regular mammograms done. She reports some discomfort in the upper part of her left breast, but only occasionally. Activity does seem to affect this. She reports that she is doing well on the Tamoxifen.     Interpreter Diane Bates present for interview, exam, and discussion.  In particular, no post menopausal uterine bleeding.  No calf tenderness.  HPI  No past medical history on file.  Past Surgical History:  Procedure Laterality Date  . BREAST BIOPSY Left 12/14/2015   stereo, atypical hyperplasia  . BREAST BIOPSY Left 01/14/2016   Procedure: BREAST BIOPSY;  Surgeon: [name], MD;  Location: [facility] ORS;  Service: General;  Laterality: Left;  . BREAST EXCISIONAL BIOPSY Left 01/14/2016   ADH  . CHOLECYSTECTOMY  2000    Family History  Problem Relation Age of Onset  . Breast cancer Neg Hx     Social History Social History   Tobacco Use  . Smoking status: Never Smoker  . Smokeless tobacco: Never Used  Substance Use Topics  . Alcohol use: No    Alcohol/week: 0.0 oz  . Drug use: No    No Known Allergies  Current Outpatient Medications  Medication Sig Dispense Refill  . acetaminophen (TYLENOL) 500 MG tablet Take 500 mg by mouth every 6 (six) hours as needed.    . tamoxifen (NOLVADEX) 20 MG tablet TAKE ONE TABLET BY MOUTH ONCE DAILY 90 tablet 4   No current facility-administered medications for this visit.     Review of Systems Review of Systems  Constitutional: Negative.   Respiratory: Negative.   Cardiovascular: Negative.     Blood pressure (!) 142/82, pulse 71, resp. rate 14, height 5' (1.524 m), weight 149 lb (67.6 kg), last  menstrual period 01/12/2016.  Physical Exam Physical Exam  Constitutional: She is oriented to person, place, and time. She appears well-developed and well-nourished.  Eyes: Conjunctivae are normal. No scleral icterus.  Neck: Neck supple.  Cardiovascular: Normal rate, regular rhythm and normal heart sounds.  Pulmonary/Chest: Effort normal and breath sounds normal. Right breast exhibits no inverted nipple, no mass, no nipple discharge, no skin change and no tenderness. Left breast exhibits no inverted nipple, no mass, no nipple discharge, no skin change and no tenderness.    Lymphadenopathy:    She has no cervical adenopathy.    She has no axillary adenopathy.  Neurological: She is alert and oriented to person, place, and time.  Skin: Skin is warm and dry.  Psychiatric: She has a normal mood and affect.    Data Reviewed Bilateral screening mammograms of Dec 11, 2017 reviewed.  BI-RADS-2.   January 14, 2016 biopsy: DIAGNOSIS:  A. BREAST, LEFT; WIDE EXCISION:  - RESIDUAL STROMAL MUCIN POOL AND CALCIFICATION, 1 MM, ADJACENT TO  BIOPSY SITE.  - BIOPSY SITE CHANGES AND MARKER CLIP PRESENT.  - NEGATIVE FOR RESIDUAL ATYPIA, AND NEGATIVE FOR MALIGNANCY.   Pathology reviewed with the pathologist by phone. ADH and the original sample. No residual ADH on wide excision. No evidence of mucinous carcinoma. Patient will be treated as slightly high risk due to the presence of ADH and offered antiestrogen therapy.  Assessment    Doing well with chemoprevention.    Plan    Follow up here in one year with bilateral screening mammograms and office follow up    The importance of promptly reporting any vaginal bleeding or calf tenderness was discussed.    HPI, Physical Exam, Assessment and Plan have been scribed under the direction and in the presence of [name], MD  Diane Living, LPN  I have completed the exam and reviewed the above documentation for accuracy and completeness.   I agree with the above.  Haematologist has been used and any errors in dictation or transcription are unintentional.  Hervey Ard, M.D., F.A.C.S.   Diane Bates 01/16/2018, 9:57 AM

## 2018-01-16 NOTE — Patient Instructions (Addendum)
Continue self breast exams. Call office for any new breast issues or concerns. Follow up here on one year with bilateral screening mammogram through [facility].

## 2018-02-02 ENCOUNTER — Ambulatory Visit (INDEPENDENT_AMBULATORY_CARE_PROVIDER_SITE_OTHER): Payer: Self-pay | Admitting: Obstetrics and Gynecology

## 2018-02-02 ENCOUNTER — Encounter: Payer: Self-pay | Admitting: Obstetrics and Gynecology

## 2018-02-02 ENCOUNTER — Other Ambulatory Visit (HOSPITAL_COMMUNITY)
Admission: RE | Admit: 2018-02-02 | Discharge: 2018-02-02 | Disposition: A | Discharge status: Home / Self Care | Payer: Self-pay | Source: Ambulatory Visit | Attending: Obstetrics and Gynecology | Admitting: Obstetrics and Gynecology

## 2018-02-02 VITALS — BP 133/79 | HR 72 | Ht 60.0 in | Wt 150.9 lb

## 2018-02-02 DIAGNOSIS — R87618 Other abnormal cytological findings on specimens from cervix uteri: Secondary | ICD-10-CM | POA: Insufficient documentation

## 2018-02-02 DIAGNOSIS — Z780 Asymptomatic menopausal state: Secondary | ICD-10-CM

## 2018-02-02 NOTE — Progress Notes (Signed)
GYNECOLOGY CLINIC PROGRESS NOTE Subjective:     Diane Bates is a 47 y.o. [id number] woman who comes in today for a work up for normal pap smear but with endometrial cells present. pap smear.  Was referred from [facility] program.  Her most recent Pap smear was on 12/11/2017. Previous abnormal Pap smears: no. Contraception: post menopausal status  The following portions of the patient's history were reviewed and updated as appropriate: allergies, current medications, past family history, past medical history, past social history, past surgical history and problem list.  Review of Systems A comprehensive review of systems was negative.   Objective:    BP 133/79   Pulse 72   Ht 5' (1.524 m)   Wt 150 lb 14.4 oz (68.4 kg)   LMP 01/12/2016   BMI 29.47 kg/m  Pelvic Exam: external genitalia normal, rectovaginal septum normal.  Vagina without discharge.  Cervix normal appearing, no lesions and no motion tenderness, slightly flushed to vaginal wall.     Cytology:  Clinical Support on 12/11/2017  Component Date Value Ref Range Status  . DIAGNOSIS: 12/11/2017 Comment   Final   Comment: OTHER: NEGATIVE FOR SQUAMOUS INTRAEPITHELIAL LESION (NSI). ENDOMETRIAL CELLS PRESENT IN A WOMAN >= 58 YEARS OF AGE. THE SHEDDING OF ENDOMETRIAL CELLS IN PERI-POST MENOPAUSAL WOMEN MAY REPRESENT BENIGN ENDOMETRIAL LESIONS, HORMONAL ALTERATIONS OR UNCOMMONLY, ENDOMETRIAL ABNORMALITIES.   Marland Kitchen Specimen adequacy: 12/11/2017 Comment   Final   Comment: Satisfactory for evaluation. Endocervical and/or squamous metaplastic cells (endocervical component) are present.   . Clinician Provided ICD10 12/11/2017 Comment   Final   Z00.00  . Performed by: 12/11/2017 Comment   Final   Vernell Barrier, Cytotechnologist (ASCP)  . Electronically signed by: 12/11/2017 Comment   Final   Adele Barthel, MD, Pathologist  . PAP Smear Comment 12/11/2017 .   Final  . Note: 12/11/2017 Comment   Final   Comment: The Pap smear is a  screening test designed to aid in the detection of premalignant and malignant conditions of the uterine cervix.  It is not a diagnostic procedure and should not be used as the sole means of detecting cervical cancer.  Both false-positive and false-negative reports do occur.   . HPV, high-risk 12/11/2017 Negative  Negative Final   Comment: This high-risk HPV test detects thirteen high-risk types (16/18/31/33/35/39/45/51/52/56/58/59/68) without differentiation.     Assessment:     Endometrial cells on pap smear, postmenopausal.   Plan:   - Discussed significance of endometrial cells on pap smear.  Endometrial biopsy performed today as recommended screening guidelines.  - Will notify patient of results by phone. If normal, no further workup needed.  If abnormal, will have patient f/u in office to discuss next steps.  Continue routine pap smear screening.   Endometrial Biopsy Procedure Note  The patient is positioned on the exam table in the dorsal lithotomy position. Bimanual exam confirms uterine position and size. A Graves speculum is placed into the vagina. A single toothed tenaculum is placed onto the anterior lip of the cervix. The pipette is placed into the endocervical canal and is advanced to the uterine fundus. Using a piston like technique, with vacuum created by withdrawing the stylus, the endometrial specimen is obtained and transferred to the biopsy container. Minimal bleeding is encountered. The procedure is well tolerated.   Uterine Position:mid    Uterine Length: 6  cm   Uterine Specimen: Scant   Post procedure instructions are given. The patient will be notified of results by phone.  Rubie Maid, MD [facility]

## 2018-02-02 NOTE — Progress Notes (Signed)
Pt stated that she is doing well.

## 2018-02-07 ENCOUNTER — Encounter: Payer: Self-pay | Admitting: Obstetrics and Gynecology

## 2018-02-16 NOTE — Progress Notes (Signed)
Per Dr. Marcelline Mates patient colposcopy/biopsy results negative.  No follow-up needed.  Copy to [facility].

## 2018-06-07 ENCOUNTER — Other Ambulatory Visit: Payer: Self-pay | Admitting: General Surgery

## 2018-06-07 ENCOUNTER — Other Ambulatory Visit: Payer: Self-pay

## 2018-06-07 MED ORDER — TAMOXIFEN CITRATE 20 MG PO TABS: 20.0000 mg | ORAL_TABLET | Freq: Every day | ORAL | 4 refills | Status: AC

## 2019-01-28 ENCOUNTER — Other Ambulatory Visit: Payer: Self-pay | Admitting: *Deleted

## 2019-01-28 DIAGNOSIS — Z1231 Encounter for screening mammogram for malignant neoplasm of breast: Secondary | ICD-10-CM

## 2019-03-20 ENCOUNTER — Encounter: Payer: Self-pay | Admitting: *Deleted

## 2019-03-20 ENCOUNTER — Ambulatory Visit: Payer: Self-pay | Attending: Oncology | Admitting: *Deleted

## 2019-03-20 ENCOUNTER — Other Ambulatory Visit: Payer: Self-pay

## 2019-03-20 ENCOUNTER — Ambulatory Visit
Admission: RE | Admit: 2019-03-20 | Discharge: 2019-03-20 | Disposition: A | Discharge status: Home / Self Care | Payer: Self-pay | Source: Ambulatory Visit | Attending: Oncology | Admitting: Oncology

## 2019-03-20 ENCOUNTER — Encounter (INDEPENDENT_AMBULATORY_CARE_PROVIDER_SITE_OTHER): Payer: Self-pay

## 2019-03-20 VITALS — BP 129/87 | HR 70 | Temp 97.8°F | Ht 60.0 in | Wt 149.0 lb

## 2019-03-20 DIAGNOSIS — Z0000 Encounter for general adult medical examination without abnormal findings: Secondary | ICD-10-CM

## 2019-03-20 NOTE — Progress Notes (Signed)
Letter mailed from the [facility] to inform patient of her normal mammogram results.  Patient is to follow-up with annual screening in one year.  [facility] to Christy. 

## 2019-03-20 NOTE — Patient Instructions (Signed)
Gave patient hand-out, Women Staying Healthy, Active and Well from [facility], with education on breast health, pap smears, heart and colon health. 

## 2019-03-20 NOTE — Progress Notes (Addendum)
  Subjective:     Patient ID: Diane Bates, female   DOB: 1971-07-02, 48 y.o.   MRN: [id number]  HPI   Review of Systems     Objective:   Physical Exam Chest:     Breasts:        Right: No swelling, bleeding, inverted nipple, mass, nipple discharge, skin change or tenderness.        Left: No swelling, bleeding, inverted nipple, mass, nipple discharge or skin change.    Lymphadenopathy:     Upper Body:     Right upper body: No supraclavicular or axillary adenopathy.     Left upper body: No supraclavicular or axillary adenopathy.        Assessment:     48 year old Hispanic female returns to [facility] for annual exam.  Lloyda, the interpreter present during the interview and exam.  Clinical breast exam unremarkable.  Taught self breast awareness.  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  She is currently taking Tamoxifen prophylactic for breast cancer risk reduction.  She has a follow up appointment with Dr. Dahlia Byes on 03/25/19.  Last pap on 12/01/17 was negative / negative.  No further follow up per Dr. Andreas Blower notes.  Next pap will be due in 2024.  Patient has been screened for eligibility.  She does not have any insurance, Medicare or Medicaid.  She also meets financial eligibility.  Hand-out given on the Affordable Care Act.  Risk Assessment    Risk Scores      03/20/2019   Last edited by: Theodore Demark, RN   5-year risk: 1.6 %   Lifetime risk: 14.1 %            Plan:     Screening mammogram ordered.  Will follow up per [facility] protocol.

## 2019-03-25 ENCOUNTER — Other Ambulatory Visit: Payer: Self-pay

## 2019-03-25 ENCOUNTER — Encounter: Payer: Self-pay | Admitting: Surgery

## 2019-03-25 ENCOUNTER — Ambulatory Visit (INDEPENDENT_AMBULATORY_CARE_PROVIDER_SITE_OTHER): Payer: PRIVATE HEALTH INSURANCE | Admitting: Surgery

## 2019-03-25 VITALS — BP 117/81 | HR 73 | Temp 97.5°F | Resp 18 | Ht 62.0 in | Wt 155.2 lb

## 2019-03-25 DIAGNOSIS — N6092 Unspecified benign mammary dysplasia of left breast: Secondary | ICD-10-CM

## 2019-03-25 DIAGNOSIS — Z0000 Encounter for general adult medical examination without abnormal findings: Secondary | ICD-10-CM

## 2019-03-25 NOTE — Progress Notes (Signed)
Outpatient Surgical Follow Up  03/25/2019  Diane Bates is an 48 y.o. female.   Chief Complaint  Patient presents with  . Follow-up    1 Yr Mammogram    HPI: Diane Bates is a 48 year old female status post left lumpectomy for atypical ductal hyperplasia by Dr. Bary Castilla on June 2017.  Since then it is on tamoxifen for chemoprophylaxis.  She did have a uterine biopsy in 2019 which I am not privy to the results.  Apparently this was benign.  She denies any uterine bleeding or any side effects to the tamoxifen.  She did complete a mammogram that I have personally review showing no evidence of any suspicious findings.  She denies any breast issues.  Some occasional left intermittent mild pains and discomfort on the biopsy site.  No fevers no chills no nipple discharge no masses. No evidence of DVTs  History reviewed. No pertinent past medical history.  Past Surgical History:  Procedure Laterality Date  . BREAST BIOPSY Left 12/14/2015   stereo, atypical hyperplasia  . BREAST BIOPSY Left 01/14/2016   Procedure: BREAST BIOPSY;  Surgeon: [name], MD;  Location: [facility] ORS;  Service: General;  Laterality: Left;  . BREAST EXCISIONAL BIOPSY Left 01/14/2016   ADH  . CHOLECYSTECTOMY  2000    Family History  Problem Relation Age of Onset  . Breast cancer Neg Hx     Social History:  reports that she has never smoked. She has never used smokeless tobacco. She reports that she does not drink alcohol or use drugs.  Allergies: No Known Allergies  Medications reviewed.    ROS Full ROS performed and is otherwise negative other than what is stated in HPI   BP 117/81   Pulse 73   Temp (!) 97.5 F (36.4 C) (Temporal)   Resp 18   Ht 5\' 2"  (1.575 m)   Wt 155 lb 3.2 oz (70.4 kg)   LMP 01/12/2016   SpO2 98%   BMI 28.39 kg/m   Physical Exam Vitals signs and nursing note reviewed. Exam conducted with a chaperone present.  Constitutional:      General: She is not in acute distress.  Appearance: Normal appearance. She is normal weight.  Neck:     Musculoskeletal: Normal range of motion and neck supple.  Cardiovascular:     Rate and Rhythm: Normal rate and regular rhythm.  Pulmonary:     Effort: Pulmonary effort is normal.     Comments: BREAST: previous Left lumpectomy scar. No new masses on either breast. NML nipple and skin. NO Ax. LAD. Abdominal:     General: Abdomen is flat. There is no distension.     Palpations: There is no mass.     Tenderness: There is no abdominal tenderness. There is no guarding or rebound.  Skin:    Capillary Refill: Capillary refill takes less than 2 seconds.  Neurological:     General: No focal deficit present.     Mental Status: She is alert and oriented to person, place, and time.  Psychiatric:        Mood and Affect: Mood normal.        Behavior: Behavior normal.        Thought Content: Thought content normal.        Assessment/Plan: 48 year old with ADH on tamoxifen, no new lesions.  We will see her back in 1 year for follow-up.  Given that she did have a biopsy and tamoxifen increases the chance of endometrial issues I  will schedule her an appointment with her GYN in about 6 months or so so they can do a good pelvic exam.  She is very appreciative  Greater than 50% of the 25  minutes  visit was spent in counseling/coordination of care   Caroleen Hamman, MD [facility]

## 2019-03-25 NOTE — Patient Instructions (Addendum)
Please follow up with your Gynecologist for your annual physical. We sent a referral to Dr.Cheery ([facility]) someone from their office will call to schedule an appointment with you.   Please call if you have questions or concerns.

## 2019-03-26 ENCOUNTER — Ambulatory Visit: Payer: PRIVATE HEALTH INSURANCE | Admitting: General Surgery

## 2019-07-24 ENCOUNTER — Encounter: Payer: Self-pay | Admitting: Obstetrics and Gynecology

## 2019-07-24 ENCOUNTER — Ambulatory Visit: Payer: PRIVATE HEALTH INSURANCE | Admitting: Obstetrics and Gynecology

## 2019-07-24 ENCOUNTER — Other Ambulatory Visit: Payer: Self-pay

## 2019-07-24 VITALS — BP 117/77 | HR 75 | Ht 62.0 in | Wt 158.8 lb

## 2019-07-24 DIAGNOSIS — Z01419 Encounter for gynecological examination (general) (routine) without abnormal findings: Secondary | ICD-10-CM

## 2019-07-24 DIAGNOSIS — Z131 Encounter for screening for diabetes mellitus: Secondary | ICD-10-CM | POA: Diagnosis not present

## 2019-07-24 DIAGNOSIS — Z13220 Encounter for screening for lipoid disorders: Secondary | ICD-10-CM | POA: Diagnosis not present

## 2019-07-24 DIAGNOSIS — N644 Mastodynia: Secondary | ICD-10-CM

## 2019-07-24 DIAGNOSIS — N6092 Unspecified benign mammary dysplasia of left breast: Secondary | ICD-10-CM

## 2019-07-24 DIAGNOSIS — Z1211 Encounter for screening for malignant neoplasm of colon: Secondary | ICD-10-CM | POA: Diagnosis not present

## 2019-07-24 DIAGNOSIS — Z79810 Long term (current) use of selective estrogen receptor modulators (SERMs): Secondary | ICD-10-CM

## 2019-07-24 NOTE — Progress Notes (Signed)
GYNECOLOGY ANNUAL PHYSICAL EXAM PROGRESS NOTE  Subjective:   Spanish Interpreter present for today's visit.    Diane Bates is a 48 y.o. G54P0 postmenopausal female who presents for an annual exam. She is currently on Tamoxifen therapy, and has been on this for 2 years (atypical ductal hyperplasia of the left breast).  The patient is sexually active. The patient wears seatbelts: yes. The patient participates in regular exercise: no. Has the patient ever been transfused or tattooed?: no. The patient reports that there is not domestic violence in her life.   The patient has the following concerns: 1. She has questions regarding the Tamoxifen.  Wonders of her risk of uterine cancer.  2. Noting pain in the left breast.  It is in the same place that she had surgery.  Has been ongoing for ~ 1 month, however for the past week symptoms have waxed and waned. Feels like a pulling sensation. Hurts more in axillary region. Denies nipple discharge. Denies heavy lifting recently.     Gynecologic History  Menarche age: 11 Patient's last menstrual period was 01/12/2016. Contraception: post menopausal status History of STI's: Denies Last Pap: 12/11/2017. Results were: NILM, but endometrial cells present.  S/p normal endometrial biopsy in 02/02/2018.  Denies h/o abnormal pap smears. Last mammogram: 03/20/2019. Results were: normal   OB History  Gravida Para Term Preterm AB Living  5 0 0 0 0 4  SAB TAB Ectopic Multiple Live Births  0 0 0 1 0    # Outcome Date GA Lbr Len/2nd Weight Sex Delivery Anes PTL Lv  5 Gravida           4 Gravida           3 Gravida           2 Gravida           1 Gravida             Obstetric Comments  1st Menstrual Cycle: 12  1st Pregnancy:  14    No past medical history on file.  Past Surgical History:  Procedure Laterality Date  . BREAST BIOPSY Left 12/14/2015   stereo, atypical hyperplasia  . BREAST BIOPSY Left 01/14/2016   Procedure: BREAST BIOPSY;   Surgeon: [name], MD;  Location: [facility] ORS;  Service: General;  Laterality: Left;  . BREAST EXCISIONAL BIOPSY Left 01/14/2016   ADH  . CHOLECYSTECTOMY  2000    Family History  Problem Relation Age of Onset  . Breast cancer Neg Hx     Social History   Socioeconomic History  . Marital status: Married    Spouse name: Not on file  . Number of children: Not on file  . Years of education: Not on file  . Highest education level: Not on file  Occupational History  . Not on file  Tobacco Use  . Smoking status: Never Smoker  . Smokeless tobacco: Never Used  Substance and Sexual Activity  . Alcohol use: No    Alcohol/week: 0.0 standard drinks  . Drug use: No  . Sexual activity: Yes    Birth control/protection: Post-menopausal  Other Topics Concern  . Not on file  Social History Narrative  . Not on file   Social Determinants of Health   Financial Resource Strain:   . Difficulty of Paying Living Expenses: Not on file  Food Insecurity:   . Worried About Charity fundraiser in the Last Year: Not on file  . Ran  Out of Food in the Last Year: Not on file  Transportation Needs:   . Lack of Transportation (Medical): Not on file  . Lack of Transportation (Non-Medical): Not on file  Physical Activity:   . Days of Exercise per Week: Not on file  . Minutes of Exercise per Session: Not on file  Stress:   . Feeling of Stress : Not on file  Social Connections:   . Frequency of Communication with Friends and Family: Not on file  . Frequency of Social Gatherings with Friends and Family: Not on file  . Attends Religious Services: Not on file  . Active Member of Clubs or Organizations: Not on file  . Attends Archivist Meetings: Not on file  . Marital Status: Not on file  Intimate Partner Violence:   . Fear of Current or Ex-Partner: Not on file  . Emotionally Abused: Not on file  . Physically Abused: Not on file  . Sexually Abused: Not on file    Current Outpatient  Medications on File Prior to Visit  Medication Sig Dispense Refill  . acetaminophen (TYLENOL) 500 MG tablet Take 500 mg by mouth every 6 (six) hours as needed.    Marland Kitchen aspirin EC 81 MG tablet Take 81 mg by mouth daily.    . tamoxifen (NOLVADEX) 20 MG tablet Take 1 tablet (20 mg total) by mouth daily. 90 tablet 4   No current facility-administered medications on file prior to visit.    No Known Allergies    Review of Systems Constitutional: negative for chills, fatigue, fevers and sweats Eyes: negative for irritation, redness and visual disturbance Ears, nose, mouth, throat, and face: negative for hearing loss, nasal congestion, snoring and tinnitus Respiratory: negative for asthma, cough, sputum Cardiovascular: negative for chest pain, dyspnea, exertional chest pressure/discomfort, irregular heart beat, palpitations and syncope Gastrointestinal: negative for abdominal pain, change in bowel habits, nausea and vomiting Genitourinary: negative for abnormal menstrual periods, genital lesions, sexual problems and vaginal discharge, dysuria and urinary incontinence Integument/breast: negative for breast lump and nipple discharge. Positive for breast tenderness (now intermittent) Hematologic/lymphatic: negative for bleeding and easy bruising Musculoskeletal:negative for back pain and muscle weakness Neurological: negative for dizziness, headaches, vertigo and weakness Endocrine: negative for diabetic symptoms including polydipsia, polyuria and skin dryness Allergic/Immunologic: negative for hay fever and urticaria        Objective:  Blood pressure 117/77, pulse 75, height 5\' 2"  (1.575 m), weight 158 lb 12.8 oz (72 kg), last menstrual period 01/12/2016. Body mass index is 29.04 kg/m.   General Appearance:    Alert, cooperative, no distress, appears stated age, overweight  Head:    Normocephalic, without obvious abnormality, atraumatic  Eyes:    PERRL, conjunctiva/corneas clear, EOM's  intact, both eyes  Ears:    Normal external ear canals, both ears  Nose:   Nares normal, septum midline, mucosa normal, no drainage or sinus tenderness  Throat:   Lips, mucosa, and tongue normal; teeth and gums normal  Neck:   Supple, symmetrical, trachea midline, no adenopathy; thyroid: no enlargement/tenderness/nodules; no carotid bruit or JVD  Back:     Symmetric, no curvature, ROM normal, no CVA tenderness  Lungs:     Clear to auscultation bilaterally, respirations unlabored  Chest Wall:    No tenderness or deformity   Heart:    Regular rate and rhythm, S1 and S2 normal, no murmur, rub or gallop  Breast Exam:    No tenderness, masses, or nipple abnormality. Scar of left breast  in upper quadrant.   Abdomen:     Soft, non-tender, bowel sounds active all four quadrants, no masses, no organomegaly.    Genitalia:    Pelvic:external genitalia normal, vagina without lesions, discharge, or tenderness, rectovaginal septum  normal. Cervix normal in appearance, no cervical motion tenderness, no adnexal masses or tenderness.  Uterus normal size, shape, mobile, regular contours, nontender.  Rectal:    Normal external sphincter.  No hemorrhoids appreciated. Internal exam not done.   Extremities:   Extremities normal, atraumatic, no cyanosis or edema  Pulses:   2+ and symmetric all extremities  Skin:   Skin color, texture, turgor normal, no rashes or lesions  Lymph nodes:   Cervical, supraclavicular, and axillary nodes normal  Neurologic:   CNII-XII intact, normal strength, sensation and reflexes throughout   .  Labs:  No results found for: WBC, HGB, HCT, MCV, PLT  No results found for: CREATININE, BUN, NA, K, CL, CO2  No results found for: ALT, AST, GGT, ALKPHOS, BILITOT  No results found for: TSH   Assessment:      1. Encounter for well woman exam with routine gynecological exam   2. Colon cancer screening   3. Screening for diabetes mellitus   4. Screening for lipid disorders   5.  Breast tenderness in female   6. Prophylactic use of tamoxifen   7. Atypical ductal hyperplasia of left breast     Plan:    - Blood tests: see orders. Patient notes she has not had labs in several years (no insurance) . - Breast self exam technique reviewed and patient encouraged to perform self-exam monthly. - Contraception: post menopausal status. - Discussed healthy lifestyle modifications. - Mammogram up to date. - Pap smear up to date.  History of endometrial cells on last pap with normal workup, no abnormal bleeding. Can continue routine q 3 year screens.  - Discussed colon cancer screening options, including colonoscopy, stool guaiac testing, or stool DNA testing with Cologard.  Patient opts for colonoscopy. Referral placed to GI.  - Flu vaccine up to date (received in November).  - Discussed patient's concerns regarding her Tamoxifen use and risk of endometrial cancer, polyps, or hyperplasia. Patient overall reassured. Thankful for the information.  - No pain illicited from left breast on today's exam. Advised on use of ice packs, NSAIDs or Tylenol, no strenuous activity with upper extremities. If no resolution over the next several weeks, encouraged her to f/u with her General Surgeon.  Recent mammogram in August was normal.  RTC in 1 year for annual exam.    Rubie Maid, MD [facility]

## 2019-07-24 NOTE — Patient Instructions (Addendum)
Cuidados preventivos en las mujeres de 27 a 76 aos de edad Preventive Care 31-50 Years Old, Female Los cuidados preventivos hacen referencia a las opciones en cuanto a las visitas al mdico y al estilo de vida, las cuales pueden promover la salud y Musician. Esto puede comprender lo siguiente:  Un examen fsico anual. Esto tambin se puede llamar control de bienestar anual.  Visitas regulares al dentista y exmenes oculares.  Vacunas.  Estudios para Engineer, building services.  Opciones saludables de estilo de vida, como seguir una dieta saludable, hacer ejercicio regularmente, no usar drogas ni productos que contengan nicotina y tabaco, y limitar el consumo de alcohol. Qu puedo esperar para mi visita de cuidado preventivo? Examen fsico El mdico revisar lo siguiente:  IT consultant y [place]. Esto se puede usar para calcular el ndice de masa corporal ([place]), que indica si tiene un peso saludable.  Frecuencia cardaca y presin arterial.  Piel para detectar manchas anormales. Asesoramiento Su mdico puede preguntarle acerca de:  Consumo de tabaco, alcohol y drogas.  Su bienestar emocional.  El bienestar en el hogar y sus relaciones personales.  Su actividad sexual.  Sus hbitos de alimentacin.  Su trabajo y [place] laboral.  Mtodos anticonceptivos.  Su ciclo menstrual.  Sus antecedentes de Media planner. Qu vacunas necesito?  [place] antigripal  Se recomienda aplicarse esta vacuna todos los Rudy. Vacuna contra el ttanos, difteria y tos ferina (Tdap)  Es posible que tenga que aplicarse un refuerzo contra el ttanos y la difteria (DT) cada 10aos. Vacuna contra la varicela  Es posible que tenga que aplicrsela si no recibi esta vacuna. Vacuna contra el herpes zster (culebrilla)  Es posible que la necesite despus de los 52 aos de [place]. Vacuna contra el sarampin, rubola y paperas (SRP)  Es posible que necesite aplicarse al menos una dosis de la vacuna  SRP si naci despus de [phone]. Podra tambin necesitar una segunda dosis. Vacuna antineumoccica conjugada (PCV13)  Puede necesitar esta vacuna si tiene determinadas enfermedades y no se vacun anteriormente. Edward Jolly antineumoccica de polisacridos (PPSV23)  Quizs tenga que aplicarse una o dos dosis si fuma o si tiene determinadas afecciones. Edward Jolly antimeningoccica conjugada (MenACWY)  Puede necesitar esta vacuna si tiene determinadas afecciones. Vacuna contra la hepatitis A  Es posible que necesite esta vacuna si tiene ciertas afecciones o si viaja o trabaja en lugares en los que podra estar expuesta a la hepatitis A. Vacuna contra la hepatitis B  Es posible que necesite esta vacuna si tiene ciertas afecciones o si viaja o trabaja en lugares en los que podra estar expuesta a la hepatitis B. Vacuna antihaemophilus influenzae tipo B (Hib)  Puede necesitar esta vacuna si tiene determinadas afecciones. Vacuna contra el virus del papiloma humano (VPH)  Si el mdico se lo recomienda, Research scientist (physical sciences) tres dosis a lo largo de 6 meses. Puede recibir las vacunas en forma de dosis individuales o en forma de dos o ms vacunas juntas en la misma inyeccin (vacunas combinadas). Hable con su mdico [employer] y beneficios de las vacunas combinadas. Qu pruebas necesito? Anlisis de [employer] de lpidos y colesterol. Estos se pueden verificar cada 5 aos o, con ms frecuencia, si usted tiene ms de 42 aos de edad.  Anlisis de hepatitisC.  Anlisis de hepatitisB. Pruebas de deteccin  Pruebas de deteccin de cncer de pulmn. Es posible que se le realice esta prueba de deteccin a partir de los 2 aos de edad, si ha fumado durante 30 aos  un paquete diario y sigue fumando o dej el hbito en algn momento en los ltimos 15 aos.  Pruebas de deteccin de cncer colorrectal. Todos los adultos a partir de los 50 aos de edad y hasta los 75 aos de edad deben hacerse esta  prueba de deteccin. El mdico puede recomendarle las pruebas de deteccin a partir de los 45 aos de edad si corre un mayor riesgo. Le realizarn pruebas cada 1 a 10 aos, segn los resultados y el tipo de prueba de deteccin.  Pruebas de deteccin de la diabetes. Esto se realiza mediante un control del azcar en la sangre (glucosa) despus de no haber comido durante un periodo de tiempo (ayuno). Es posible que se le realice esta prueba cada 1 a 3 aos.  Mamografa. Se puede realizar cada 1 o 2 aos. Hable con su mdico sobre cundo debe comenzar a realizarse mamografas de manera regular. Esto depende de si tiene antecedentes familiares de cncer de mama o no.  Pruebas de deteccin de cncer relacionado con las mutaciones del BRCA. Es posible que se las deba realizar si tiene antecedentes de cncer de mama, de ovario, de trompas o peritoneal.  Examen plvico y prueba de Papanicolaou. Esto se puede realizar cada 3aos a partir de los 21aos de edad. A partir de los 30 aos, esto se puede realizar cada 5 aos si usted se realiza una prueba de Papanicolaou en combinacin con una prueba de deteccin del virus del papiloma humano (VPH). Otras pruebas  Anlisis de enfermedades de transmisin sexual (ETS).  Densitometra sea. Esto se realiza para detectar osteoporosis. Se le puede realizar este examen de deteccin si tiene un riesgo alto de tener osteoporosis. Siga estas instrucciones en su casa: Comida y bebida  Siga una dieta que incluya frutas frescas y verduras, cereales integrales, lcteos descremados y protenas magras.  Tome los suplementos vitamnicos y minerales como se lo haya indicado el mdico.  No beba alcohol si: ? Su mdico le indica no hacerlo. ? Est embarazada, puede estar embarazada o est tratando de quedar embarazada.  Si bebe alcohol: ? Limite la cantidad que consume de 0 a 1 medida por da. ? Est atenta a la cantidad de alcohol que hay en las bebidas que toma. En los  Estados [place], una medida equivale a una botella de cerveza de 12oz (355ml), un vaso de vino de 5oz (148ml) o un vaso de una bebida alcohlica de alta graduacin de 1oz (44ml). Estilo de vida  Cudese los dientes y las encas a diario.  Mantngase activa. Haga al menos 30minutos de ejercicio 5o ms das de la semana.  No consuma ningn producto que contenga nicotina o tabaco, como cigarrillos, cigarrillos electrnicos y tabaco de mascar. Si necesita ayuda para dejar de fumar, consulte al mdico.  Si es sexualmente activa, practique sexo seguro. Use un condn u otra forma de mtodo anticonceptivo (anticonceptivos) a fin de evitar un embarazo e ITS (infecciones de transmisin sexual).  Si el mdico se lo indic, tome una dosis baja de aspirina diariamente a partir de los 50 aos de edad. Cundo volver?  Visite al mdico una vez al ao para una visita de control.  Pregntele al mdico con qu frecuencia debe realizarse un control de la vista y los dientes.  Mantenga su esquema de vacunacin al da. Esta informacin no tiene como fin reemplazar el consejo del mdico. Asegrese de hacerle al mdico cualquier pregunta que tenga. Document Released: 11/29/2016 Document Revised: 05/03/2018 Document Reviewed: 05/03/2018 Elsevier Patient   Education  2020 [place] de [employer] Breast Self-Awareness El autoexamen de mamas es para Civil engineer, contracting la apariencia y la sensibilidad de las [place]. Es importante autoexaminarse las [place]. Permite detectar un problema en las mamas a tiempo mientras todava es pequeo y puede tratarse. Todas las mujeres deben autoexaminarse las [place], incluso aquellas que se sometieron a implantes mamarios. Informe al mdico si advierte un cambio en las mamas. Lo que necesita:  Un espejo.  Una habitacin bien iluminada. Cmo realizar el autoexamen de mamas El autoexamen de [place] es una forma de aprender qu es normal para sus mamas y revisar si hay cambios.  Para hacer un autoexamen de las mamas: Busque cambios  1. Qutese toda la ropa por encima de la cintura. 2. Prese frente a un espejo en una habitacin con buena iluminacin. 3. Apoye las manos en las caderas. 4. Empuje hacia abajo con las manos. [place] y los pezones en el espejo para ver si una mama o un pezn se ve diferente del otro. Durante el examen, intente determinar si: ? La forma de una mama es diferente. ? El tamao de [place] mama es [place]. ? Hay arrugas, depresiones y protuberancias en [place] y no en la otra. 6. Observe cada mama para buscar cambios en la piel, por ejemplo: ? Enrojecimiento. ? Zonas escamosas. 7. Observe si hay cambios en los pezones, por ejemplo: ? Lquido alrededor [employer]. ? Man. ? Hoyuelos. ? Enrojecimiento. ? Un cambio en el lugar de los pezones. [place] si hay cambios  1. Acustese en el piso boca arriba. 2. Plpese cada mama. Para hacerlo, siga estos pasos: ? Elija una mama para palpar. ? Coloque el brazo ms cercano a esa mama por encima de la cabeza. ? Use el otro brazo para palpar la zona del pezn de la mama. Plpese la zona con las yemas de los tres dedos del medio y haga crculos con los dedos. Con el primer crculo, presione suavemente. Con el segundo, ms fuerte. Con el tercero, an ms fuerte. ? Siga haciendo crculos con los dedos con las diferentes presiones a medida que desciende por la mama. Detngase cuando sienta las costillas. ? Desplace los dedos un poco hacia el centro del cuerpo. ? Empiece a hacer crculos con los dedos nuevamente y esta vez haga movimientos ascendentes hasta llegar a la clavcula. ? Siga haciendo crculos [place] y [place] llegar a la axila. Recuerde hacerlos con las tres presiones. ? Plpese la otra mama de la misma forma. 3. Sintese o prese en la ducha o la baera. 4. Con agua jabonosa en la piel, plpese cada mama del mismo modo que lo hizo en el paso2 mientras  estaba acostada en el piso. Anote sus hallazgos Anotar lo que encuentra puede ayudarla a recordar qu contarle al mdico. [place] los siguientes datos:  Qu es normal para cada mama.  Cualquier cambio que encuentre en cada mama, por ejemplo: ? La clase de cambios que encuentra. ? Si tiene dolor. ? Si hay bultos, su tamao y [place].  Cundo tuvo su ltimo perodo menstrual. Consejos generales  [facility].  Si est amamantando, el mejor momento para el examen de las mamas es despus de darle de Administrator, arts al beb o despus de usar un sacaleches.  Si tiene perodos menstruales, el mejor momento para hacerlo es de 5 a 7das despus de la finalizado el perodo menstrual.  Con el tiempo, se sentir cmoda con el  autoexamen y comenzar a saber si hay cambios en sus mamas. Comunquese con un mdico si:  Observa un cambio en la forma o el tamao de las mamas o los pezones.  Observa un cambio en la piel de las mamas o los pezones, como la piel enrojecida o escamosa.  Tiene secrecin de lquido proveniente de los pezones que no es normal.  [place] un ndulo o una zona engrosada que no tena antes.  Tiene dolor en las [place].  Tiene alguna inquietud [employer] de la [place]. Resumen  El autoexamen de mamas incluye buscar cambios en las [place], y tambin palpar para Hydrographic surveyor cualquier cambio en las [place].  El autoexamen de mamas debe hacerse frente a un espejo en una habitacin bien iluminada.  Debe revisarse las [employer]. Si tiene perodos menstruales, el mejor momento para hacerlo es de 5 a 7das despus de la finalizado el perodo menstrual.  Informe al mdico si ve cambios en las mamas, como cambios en el tamao, cambios en la piel, Social research officer, government o sensibilidad, o un lquido inusual que sale de los pezones. Esta informacin no tiene Marine scientist el consejo del mdico. Asegrese de hacerle al mdico cualquier pregunta que tenga. Document  Released: 08/20/2010 Document Revised: 04/17/2018 Document Reviewed: 04/17/2018 Elsevier Patient Education  2020 [place] a la palpacin de las mamas Breast Tenderness El dolor a la palpacin de las mamas es un problema frecuente en las mujeres de todas las edades. El dolor a la palpacin de las mamas puede causar molestias leves o dolor intenso. En general, el dolor es intermitente y se relaciona con el ciclo menstrual, pero puede ser [place]. [employer] causas posibles del dolor a la palpacin de las [place], incluidos los cambios hormonales y algunos medicamentos. El mdico podr solicitar estudios, como una mamografa o [place], para descartar otras causas del dolor. Por lo general, el dolor a la palpacin de las mamas no significa que usted Primary school teacher de mama. Siga estas indicaciones en su casa: A veces, tener la tranquilidad de que no padece cncer de mama es lo nico que se necesita. En general, deber seguir estas instrucciones para el cuidado en el hogar: Control del dolor y [place] molestias   Si se lo indican, aplique hielo sobre la zona: ? Field seismologist hielo en una bolsa plstica. ? Coloque una [employer] piel y la bolsa de hielo. ? Coloque el hielo durante 42mnutos, de 2 a 3veces por da.  Use un sostn de soporte, especialmente mientras hace actividad fsica. Quizs tambin desee usar ese sostn para dormir, si siente las mHewlett-Packard Medicamentos  TDelphide venta libre y los recetados solamente como se lo haya indicado el mdico. Si el dolor es causado por alguna infeccin, posiblemente le receten un antibitico.  Si le recetaron un antibitico, tmelo como se lo haya indicado el mdico. No deje de tomar los antibiticos aunque comience a sSports administrator Instrucciones generales   El mdico puede recomendarle consumir menos grasas. Puede hacer lo siguiente: ? Limite el consumo de alimentos fritos. ? A la hora de cocinarlos,  opte por hornearlos, hervirlos, grillarlos y asarlos a lAdministrator, arts  Disminuya la cantidad de cafena de su dieta. Puede lograrlo si bebe ms agua y opta por opciones sin cafena.  Lleva un registro de los das y las horas cuando tiene mayor sensibilidad en las mAlder  Pregntele al mdico cmo debe hacerse los exmenes de  mamas en su casa. Esto la ayudar a notar si tiene algn crecimiento o bulto fuera de lo normal. Comunquese con un mdico si:  Cualquier zona de la mama est dura, enrojecida y caliente al tacto. Puede ser un signo de infeccin.  Si no est en etapa de amamantamiento y [employer] lquido de los pezones, especialmente sangre o pus.  Tiene fiebre.  Tiene un bulto nuevo o doloroso en la mama que no desaparece despus de la finalizacin del perodo menstrual.  El dolor no mejora o [place].  El Airline pilot impide realizar las actividades cotidianas. Esta informacin no tiene Marine scientist el consejo del mdico. Asegrese de hacerle al mdico cualquier pregunta que tenga. Document Released: 05/08/2013 Document Revised: 01/12/2017 Document Reviewed: 12/08/2015 Elsevier Patient Education  2020 [employer].

## 2019-07-24 NOTE — Progress Notes (Signed)
Pt present for annual exam. Pt stated having left breast pain for 1 month. Pt had flu vaccine in November 2020. Pt stated that she has some questions concerning her medication Tamoxifen and the possible cause for cancer.  Pt last pap 12/11/17. Last mammogram 03/20/19.

## 2019-07-25 LAB — LIPID PANEL
Chol/HDL Ratio: 2.9 ratio (ref 0.0–4.4)
Cholesterol, Total: 153 mg/dL (ref 100–199)
HDL: 52 mg/dL (ref >39)
LDL Chol Calc (NIH): 78 mg/dL (ref 0–99)
Triglycerides: 131 mg/dL (ref 0–149)
VLDL Cholesterol Cal: 23 mg/dL (ref 5–40)

## 2019-07-25 LAB — HEMOGLOBIN A1C
Est. average glucose Bld gHb Est-mCnc: 111 mg/dL
Hgb A1c MFr Bld: 5.5 % (ref 4.8–5.6)

## 2019-07-25 LAB — TSH: TSH: 1.66 u[IU]/mL (ref 0.450–4.500)

## 2019-07-25 NOTE — Progress Notes (Signed)
Please inform patient of normal results. Patient is Spanish-speaking and will need an interpreter.  Dr. Marcelline Mates

## 2019-08-09 ENCOUNTER — Encounter: Payer: Self-pay | Admitting: *Deleted

## 2019-10-26 ENCOUNTER — Ambulatory Visit: Payer: Self-pay | Attending: Internal Medicine

## 2019-10-26 DIAGNOSIS — Z23 Encounter for immunization: Secondary | ICD-10-CM

## 2019-10-26 NOTE — Progress Notes (Signed)
   Covid-19 Vaccination Clinic  Name:  Diane Bates    MRN: [id number] DOB: 1971/04/10  10/26/2019  Diane Bates was observed post Covid-19 immunization for 15 minutes without incident. She was provided with Vaccine Information Sheet and instruction to access the V-Safe system.   Diane Bates was instructed to call 911 with any severe reactions post vaccine: Marland Kitchen Difficulty breathing  . Swelling of face and throat  . A fast heartbeat  . A bad rash all over body  . Dizziness and weakness   Immunizations Administered    Name Date Dose VIS Date Route   Pfizer COVID-19 Vaccine 10/26/2019  3:51 PM 0.3 mL 07/12/2019 Intramuscular   Manufacturer: [place]   Lot: [id number]   [place]: [id number]

## 2019-11-16 ENCOUNTER — Ambulatory Visit: Payer: Self-pay | Attending: Internal Medicine

## 2019-11-16 ENCOUNTER — Other Ambulatory Visit: Payer: Self-pay

## 2019-11-16 DIAGNOSIS — Z23 Encounter for immunization: Secondary | ICD-10-CM

## 2019-11-16 NOTE — Progress Notes (Signed)
   Covid-19 Vaccination Clinic  Name:  Toyoko Nistler    MRN: [id number] DOB: 05-Feb-1971  11/16/2019  Ms. Schedler was observed post Covid-19 immunization for 15 minutes without incident. She was provided with Vaccine Information Sheet and instruction to access the V-Safe system.   Ms. Yount was instructed to call 911 with any severe reactions post vaccine: Marland Kitchen Difficulty breathing  . Swelling of face and throat  . A fast heartbeat  . A bad rash all over body  . Dizziness and weakness   Immunizations Administered    Name Date Dose VIS Date Route   Pfizer COVID-19 Vaccine 11/16/2019  3:47 PM 0.3 mL 07/12/2019 Intramuscular   Manufacturer: [place]   Lot: [id number]   [place]: [id number]

## 2020-01-16 ENCOUNTER — Other Ambulatory Visit: Payer: Self-pay

## 2020-01-16 DIAGNOSIS — Z1231 Encounter for screening mammogram for malignant neoplasm of breast: Secondary | ICD-10-CM

## 2020-04-22 ENCOUNTER — Ambulatory Visit: Payer: Self-pay | Attending: Oncology

## 2020-04-22 ENCOUNTER — Ambulatory Visit
Admission: RE | Admit: 2020-04-22 | Discharge: 2020-04-22 | Disposition: A | Discharge status: Home / Self Care | Payer: Self-pay | Source: Ambulatory Visit | Attending: Oncology | Admitting: Oncology

## 2020-04-22 ENCOUNTER — Other Ambulatory Visit: Payer: Self-pay

## 2020-04-22 VITALS — BP 125/75 | HR 70 | Temp 98.9°F | Ht <= 58 in | Wt 158.0 lb

## 2020-04-22 DIAGNOSIS — Z0000 Encounter for general adult medical examination without abnormal findings: Secondary | ICD-10-CM | POA: Insufficient documentation

## 2020-04-22 NOTE — Progress Notes (Signed)
  Subjective:     Patient ID: Bettina Gavia, female   DOB: 05-15-1971, 49 y.o.   MRN: [id number]  HPI   Review of Systems     Objective:   Physical Exam Chest:     Breasts:        Right: No swelling, bleeding, inverted nipple, mass, nipple discharge, skin change or tenderness.        Left: No swelling, bleeding, inverted nipple, mass, nipple discharge, skin change or tenderness.       Comments: Left breast scar.         Assessment:     49 year old Hispanic patient returns for BCCCP screening. Adin Hector interpreted exam.  Patient screened, and meets BCCCP eligibility.  Patient does not have insurance, Medicare or Medicaid. Instructed patient on breast self awareness using teach back method.  Clinical breast exam unremarkable.  Risk Assessment    Risk Scores      04/22/2020 03/20/2019   Last edited by: Orson Slick, CMA Theodore Demark, RN   5-year risk: 1.7 % 1.6 %   Lifetime risk: 13.9 % 14.1 %            Plan:     Sent for bilateral screening mammogram.

## 2020-04-29 ENCOUNTER — Ambulatory Visit (INDEPENDENT_AMBULATORY_CARE_PROVIDER_SITE_OTHER): Payer: Self-pay | Admitting: Surgery

## 2020-04-29 ENCOUNTER — Other Ambulatory Visit: Payer: Self-pay

## 2020-04-29 ENCOUNTER — Encounter: Payer: Self-pay | Admitting: Surgery

## 2020-04-29 VITALS — BP 124/81 | HR 75 | Temp 98.0°F | Resp 12 | Ht 60.0 in | Wt 158.0 lb

## 2020-04-29 DIAGNOSIS — Z9889 Other specified postprocedural states: Secondary | ICD-10-CM

## 2020-04-29 DIAGNOSIS — Z1211 Encounter for screening for malignant neoplasm of colon: Secondary | ICD-10-CM

## 2020-04-29 NOTE — Patient Instructions (Addendum)
We will contact you next August 2022 to schedule your mammogram and follow up breast exam with Dr.Pabon. Please call if you have any questions or concerns.   Referral for Colonoscopy has been sent to [facility]. Someone from their office will contact you to schedule an appointment within 5-7 days.    Breast Self-Awareness Breast self-awareness is knowing how your breasts look and feel. Doing breast self-awareness is important. It allows you to catch a breast problem early while it is still small and can be treated. All women should do breast self-awareness, including women who have had breast implants. Tell your doctor if you notice a change in your breasts. What you need:  A mirror.  A well-lit room. How to do a breast self-exam A breast self-exam is one way to learn what is normal for your breasts and to check for changes. To do a breast self-exam: Look for changes  1. Take off all the clothes above your waist. 2. Stand in front of a mirror in a room with good lighting. 3. Put your hands on your hips. 4. Push your hands down. 5. Look at your breasts and nipples in the mirror to see if one breast or nipple looks different from the other. Check to see if: ? The shape of one breast is different. ? The size of one breast is different. ? There are wrinkles, dips, and bumps in one breast and not the other. 6. Look at each breast for changes in the skin, such as: ? Redness. ? Scaly areas. 7. Look for changes in your nipples, such as: ? Liquid around the nipples. ? Bleeding. ? [place]. ? Redness. ? A change in where the nipples are. Feel for changes  1. Lie on your back on the floor. 2. Feel each breast. To do this, follow these steps: ? Pick a breast to feel. ? Put the arm closest to that breast above your head. ? Use your other arm to feel the nipple area of your breast. Feel the area with the pads of your three middle fingers by making small circles with your  fingers. For the first circle, press lightly. For the second circle, press harder. For the third circle, press even harder. ? Keep making circles with your fingers at the different pressures as you move down your breast. Stop when you feel your ribs. ? Move your fingers a little toward the center of your body. ? Start making circles with your fingers again, this time going up until you reach your collarbone. ? Keep making up-and-down circles until you reach your armpit. Remember to keep using the three pressures. ? Feel the other breast in the same way. 3. Sit or stand in the tub or shower. 4. With soapy water on your skin, feel each breast the same way you did in step 2 when you were lying on the floor. Write down what you find Writing down what you find can help you remember what to tell your doctor. Write down:  What is normal for each breast.  Any changes you find in each breast, including: ? The kind of changes you find. ? Whether you have pain. ? Size and location of any lumps.  When you last had your menstrual period. General tips  Check your breasts every month.  If you are breastfeeding, the best time to check your breasts is after you feed your baby or after you use a breast pump.  If you get menstrual periods, the best  time to check your breasts is 5-7 days after your menstrual period is over.  With time, you will become comfortable with the self-exam, and you will begin to know if there are changes in your breasts. Contact a doctor if you:  See a change in the shape or size of your breasts or nipples.  See a change in the skin of your breast or nipples, such as red or scaly skin.  Have fluid coming from your nipples that is not normal.  Find a lump or thick area that was not there before.  Have pain in your breasts.  Have any concerns about your breast health. Summary  Breast self-awareness includes looking for changes in your breasts, as well as feeling for  changes within your breasts.  Breast self-awareness should be done in front of a mirror in a well-lit room.  You should check your breasts every month. If you get menstrual periods, the best time to check your breasts is 5-7 days after your menstrual period is over.  Let your doctor know of any changes you see in your breasts, including changes in size, changes on the skin, pain or tenderness, or fluid from your nipples that is not normal. This information is not intended to replace advice given to you by your health care provider. Make sure you discuss any questions you have with your health care provider. Document Revised: 03/06/2018 Document Reviewed: 03/06/2018 Elsevier Patient Education  [place].

## 2020-05-01 NOTE — Progress Notes (Signed)
Outpatient Surgical Follow Up  05/01/2020  Diane Bates is an 49 y.o. female.   Chief Complaint  Patient presents with  . Follow-up    1 yr screening mammo    HPI: Diane Bates is a 49 year old female with a prior history of excisional left breast biopsy for ADH.  There is no evidence of malignancy.  She most recently had a mammogram that I have personally reviewed showing no evidence of any suspicious lesions.  She does have some left axillary discomfort/fullness.  She reports that is not a real pain but it just feels different.  She wants to have this checked by her provider.  She denies any fevers any chills.  No evidence of breast masses.  There is no evidence of nipple discharge skin changes or axillary lymphadenopathy.  History reviewed. No pertinent past medical history.  Past Surgical History:  Procedure Laterality Date  . BREAST BIOPSY Left 12/14/2015   stereo, atypical hyperplasia  . BREAST BIOPSY Left 01/14/2016   Procedure: BREAST BIOPSY;  Surgeon: [name], MD;  Location: [facility] ORS;  Service: General;  Laterality: Left;  . BREAST EXCISIONAL BIOPSY Left 01/14/2016   ADH  . CHOLECYSTECTOMY  2000    Family History  Problem Relation Age of Onset  . Healthy Mother   . Breast cancer Neg Hx     Social History:  reports that she has never smoked. She has never used smokeless tobacco. She reports that she does not drink alcohol and does not use drugs.  Allergies: No Known Allergies  Medications reviewed.    ROS Full ROS performed and is otherwise negative other than what is stated in HPI   BP 124/81   Pulse 75   Temp 98 F (36.7 C) (Oral)   Resp 12   Ht 5' (1.524 m)   Wt 158 lb (71.7 kg)   LMP 01/12/2016   SpO2 95%   BMI 30.86 kg/m   Physical Exam Vitals and nursing note reviewed. Exam conducted with a chaperone present.  Constitutional:      General: She is not in acute distress.    Appearance: Normal appearance. She is normal weight.  Eyes:      General: No scleral icterus.       Right eye: No discharge.        Left eye: No discharge.  Cardiovascular:     Rate and Rhythm: Normal rate and regular rhythm.     Heart sounds: No murmur heard.   Pulmonary:     Effort: Pulmonary effort is normal. No respiratory distress.     Breath sounds: Normal breath sounds. No stridor.     Comments: BREAST: left lumpectomy, no masses, no LAD, . Axilla w/o palpable lesion, no infection or masses. Abdominal:     General: Abdomen is flat. There is no distension.     Palpations: Abdomen is soft. There is no mass.     Tenderness: There is no abdominal tenderness. There is no rebound.     Hernia: No hernia is present.  Musculoskeletal:        General: Normal range of motion.     Cervical back: Normal range of motion and neck supple. No rigidity or tenderness.  Lymphadenopathy:     Cervical: No cervical adenopathy.  Skin:    General: Skin is warm and dry.     Capillary Refill: Capillary refill takes less than 2 seconds.  Neurological:     General: No focal deficit present.  Mental Status: She is alert and oriented to person, place, and time.  Psychiatric:        Mood and Affect: Mood normal.        Behavior: Behavior normal.        Thought Content: Thought content normal.        Judgment: Judgment normal.      Assessment/Plan: 49 year old female with Hx ADH and now with left axillary discomfort without any evidence of pathological lesions or numb imaging studies or physical exam.  Will recommend yearly mammogram and physical exam.  I will also request a screening colonoscopy given that she has never had one.  We will see her back next year.   Greater than 50% of the 25 minutes  visit was spent in counseling/coordination of care   Caroleen Hamman, MD [place] Surgeon

## 2020-05-19 ENCOUNTER — Ambulatory Visit (INDEPENDENT_AMBULATORY_CARE_PROVIDER_SITE_OTHER): Payer: Self-pay | Admitting: Gastroenterology

## 2020-05-19 ENCOUNTER — Other Ambulatory Visit: Payer: Self-pay

## 2020-05-19 DIAGNOSIS — Z1211 Encounter for screening for malignant neoplasm of colon: Secondary | ICD-10-CM

## 2020-05-19 MED ORDER — NA SULFATE-K SULFATE-MG SULF 17.5-3.13-1.6 GM/177ML PO SOLN: 1.0000 | Freq: Once | ORAL | 0 refills | Status: AC

## 2020-05-19 NOTE — Progress Notes (Signed)
Gastroenterology Pre-Procedure Review  Request Date: Thursday 06/04/20 Requesting Physician: Dr. Bonna Gains  PATIENT REVIEW QUESTIONS: The patient responded to the following health history questions as indicated:    1. Are you having any GI issues? no 2. Do you have a personal history of Polyps? no 3. Do you have a family history of Colon Cancer or Polyps? no 4. Diabetes Mellitus? no 5. Joint replacements in the past 12 months?no 6. Major health problems in the past 3 months?no 7. Any artificial heart valves, MVP, or defibrillator?no    MEDICATIONS & ALLERGIES:    Patient reports the following regarding taking any anticoagulation/antiplatelet therapy:   Plavix, Coumadin, Eliquis, Xarelto, Lovenox, Pradaxa, Brilinta, or Effient? no Aspirin? no  Patient confirms/reports the following medications:  Current Outpatient Medications  Medication Sig Dispense Refill  . acetaminophen (TYLENOL) 500 MG tablet Take 500 mg by mouth every 6 (six) hours as needed.    Marland Kitchen aspirin EC 81 MG tablet Take 81 mg by mouth daily.    . tamoxifen (NOLVADEX) 20 MG tablet Take 1 tablet (20 mg total) by mouth daily. 90 tablet 4   No current facility-administered medications for this visit.    Patient confirms/reports the following allergies:  No Known Allergies  No orders of the defined types were placed in this encounter.   AUTHORIZATION INFORMATION Primary Insurance: 1D#: Group #:  Secondary Insurance: 1D#: Group #:  SCHEDULE INFORMATION: Date: Thursday 06/04/20 Time: Location:[facility]

## 2020-05-22 NOTE — Progress Notes (Signed)
Letter mailed from [facility] to notify of normal mammogram results.  Patient to return in one year for annual screening.  Copy to [facility]. 

## 2020-06-02 ENCOUNTER — Other Ambulatory Visit
Admission: RE | Admit: 2020-06-02 | Discharge: 2020-06-02 | Disposition: A | Discharge status: Home / Self Care | Payer: HRSA Program | Source: Ambulatory Visit | Attending: Gastroenterology | Admitting: Gastroenterology

## 2020-06-02 ENCOUNTER — Other Ambulatory Visit: Payer: Self-pay

## 2020-06-02 DIAGNOSIS — Z01812 Encounter for preprocedural laboratory examination: Secondary | ICD-10-CM | POA: Diagnosis present

## 2020-06-02 DIAGNOSIS — Z20822 Contact with and (suspected) exposure to covid-19: Secondary | ICD-10-CM | POA: Insufficient documentation

## 2020-06-02 LAB — SARS CORONAVIRUS 2 (TAT 6-24 HRS): SARS Coronavirus 2: NEGATIVE

## 2020-06-03 ENCOUNTER — Encounter: Payer: Self-pay | Admitting: Gastroenterology

## 2020-06-04 ENCOUNTER — Ambulatory Visit: Payer: Self-pay | Admitting: Anesthesiology

## 2020-06-04 ENCOUNTER — Encounter: Payer: Self-pay | Admitting: Gastroenterology

## 2020-06-04 ENCOUNTER — Ambulatory Visit
Admission: RE | Admit: 2020-06-04 | Discharge: 2020-06-04 | Disposition: A | Discharge status: Home / Self Care | Payer: Self-pay | Attending: Gastroenterology | Admitting: Gastroenterology

## 2020-06-04 ENCOUNTER — Encounter
Admission: RE | Disposition: A | Discharge status: Home / Self Care | Payer: Self-pay | Source: Home / Self Care | Attending: Gastroenterology

## 2020-06-04 ENCOUNTER — Other Ambulatory Visit: Payer: Self-pay

## 2020-06-04 DIAGNOSIS — D125 Benign neoplasm of sigmoid colon: Secondary | ICD-10-CM | POA: Insufficient documentation

## 2020-06-04 DIAGNOSIS — K635 Polyp of colon: Secondary | ICD-10-CM

## 2020-06-04 DIAGNOSIS — Z853 Personal history of malignant neoplasm of breast: Secondary | ICD-10-CM | POA: Insufficient documentation

## 2020-06-04 DIAGNOSIS — Z1211 Encounter for screening for malignant neoplasm of colon: Secondary | ICD-10-CM | POA: Insufficient documentation

## 2020-06-04 HISTORY — PX: COLONOSCOPY WITH PROPOFOL: SHX5780

## 2020-06-04 HISTORY — DX: Malignant (primary) neoplasm, unspecified: C80.1

## 2020-06-04 LAB — POCT PREGNANCY, URINE: Preg Test, Ur: NEGATIVE

## 2020-06-04 SURGERY — COLONOSCOPY WITH PROPOFOL
Anesthesia: General

## 2020-06-04 MED ORDER — DEXMEDETOMIDINE HCL 200 MCG/2ML IV SOLN
INTRAVENOUS | Status: DC | PRN
  Administered 2020-06-04: 4 ug via INTRAVENOUS

## 2020-06-04 MED ORDER — PHENYLEPHRINE HCL (PRESSORS) 10 MG/ML IV SOLN
INTRAVENOUS | Status: DC | PRN
  Administered 2020-06-04 (×2): 50 ug via INTRAVENOUS

## 2020-06-04 MED ORDER — LIDOCAINE HCL (CARDIAC) PF 100 MG/5ML IV SOSY
PREFILLED_SYRINGE | INTRAVENOUS | Status: DC | PRN
  Administered 2020-06-04: 100 mg via INTRAVENOUS

## 2020-06-04 MED ORDER — PROPOFOL 10 MG/ML IV BOLUS
INTRAVENOUS | Status: DC | PRN
  Administered 2020-06-04 (×2): 20 mg via INTRAVENOUS
  Administered 2020-06-04 (×2): 10 mg via INTRAVENOUS
  Administered 2020-06-04: 20 mg via INTRAVENOUS

## 2020-06-04 MED ORDER — PROPOFOL 500 MG/50ML IV EMUL
INTRAVENOUS | Status: AC
  Filled 2020-06-04: qty 50

## 2020-06-04 MED ORDER — LIDOCAINE HCL (PF) 2 % IJ SOLN
INTRAMUSCULAR | Status: AC
  Filled 2020-06-04: qty 5

## 2020-06-04 MED ORDER — ONDANSETRON HCL 4 MG/2ML IJ SOLN
INTRAMUSCULAR | Status: DC | PRN
  Administered 2020-06-04: 4 mg via INTRAVENOUS

## 2020-06-04 MED ORDER — ONDANSETRON HCL 4 MG/2ML IJ SOLN
INTRAMUSCULAR | Status: AC
  Filled 2020-06-04: qty 2

## 2020-06-04 MED ORDER — PROPOFOL 500 MG/50ML IV EMUL
INTRAVENOUS | Status: DC | PRN
  Administered 2020-06-04: 200 ug/kg/min via INTRAVENOUS

## 2020-06-04 MED ORDER — SODIUM CHLORIDE 0.9 % IV SOLN: INTRAVENOUS | Status: DC

## 2020-06-04 NOTE — Anesthesia Postprocedure Evaluation (Signed)
Anesthesia Post Note  Patient: Diane Bates  Procedure(s) Performed: COLONOSCOPY WITH PROPOFOL (N/A )  Patient location during evaluation: Phase II Anesthesia Type: General Level of consciousness: awake, awake and alert and oriented Pain management: pain level controlled Vital Signs Assessment: post-procedure vital signs reviewed and stable Respiratory status: spontaneous breathing Cardiovascular status: blood pressure returned to baseline and stable Postop Assessment: no headache and no backache Anesthetic complications: no   No complications documented.   Last Vitals:  Vitals:   06/04/20 1155 06/04/20 1157  BP: (!) 101/54   Pulse: 73   Resp: (!) 22   Temp: 36.9 C (!) 36.1 C  SpO2: 97%     Last Pain:  Vitals:   06/04/20 1155  TempSrc: Temporal  PainSc: Asleep                 Dierdre Forth [name]

## 2020-06-04 NOTE — Transfer of Care (Signed)
Immediate Anesthesia Transfer of Care Note  Patient: Diane Bates  Procedure(s) Performed: COLONOSCOPY WITH PROPOFOL (N/A )  Patient Location: PACU  Anesthesia Type:General  Level of Consciousness: awake  Airway & Oxygen Therapy: Patient Spontanous Breathing  Post-op Assessment: Report given to RN and Post -op Vital signs reviewed and stable  Post vital signs: Reviewed  Last Vitals:  Vitals Value Taken Time  BP 101/54 06/04/20 1155  Temp 36.1 C 06/04/20 1157  Pulse 73 06/04/20 1157  Resp 22 06/04/20 1157  SpO2 98 % 06/04/20 1157  Vitals shown include unvalidated device data.  Last Pain:  Vitals:   06/04/20 1155  TempSrc: Temporal  PainSc: Asleep         Complications: No complications documented.

## 2020-06-04 NOTE — H&P (Signed)
Vonda Antigua, MD [address], [place], [place], [place], [address] [address], [place], [place], [place], [address] Phone: [phone]  Fax: [phone]  Primary Care Physician:  Patient, No Pcp Per   Pre-Procedure History & Physical: HPI:  Diane Bates is a 49 y.o. female is here for a colonoscopy.   Past Medical History:  Diagnosis Date  . Cancer [facility])    breast cancer    Past Surgical History:  Procedure Laterality Date  . BREAST BIOPSY Left 12/14/2015   stereo, atypical hyperplasia  . BREAST BIOPSY Left 01/14/2016   Procedure: BREAST BIOPSY;  Surgeon: [name], MD;  Location: [facility] ORS;  Service: General;  Laterality: Left;  . BREAST EXCISIONAL BIOPSY Left 01/14/2016   ADH  . CHOLECYSTECTOMY  2000    Prior to Admission medications   Medication Sig Start Date End Date Taking? Authorizing Provider  tamoxifen (NOLVADEX) 20 MG tablet Take 1 tablet (20 mg total) by mouth daily. 06/07/18  Yes Byrnett, Forest Gleason, MD  acetaminophen (TYLENOL) 500 MG tablet Take 500 mg by mouth every 6 (six) hours as needed. Patient not taking: Reported on 05/19/2020    [provider]  aspirin EC 81 MG tablet Take 81 mg by mouth daily.    [provider]    Allergies as of 05/19/2020  . (No Known Allergies)    Family History  Problem Relation Age of Onset  . Healthy Mother   . Breast cancer Neg Hx     Social History   Socioeconomic History  . Marital status: Married    Spouse name: Not on file  . Number of children: Not on file  . Years of education: Not on file  . Highest education level: Not on file  Occupational History  . Not on file  Tobacco Use  . Smoking status: Never Smoker  . Smokeless tobacco: Never Used  Vaping Use  . Vaping Use: Never used  Substance and Sexual Activity  . Alcohol use: No    Alcohol/week: 0.0 standard drinks  . Drug use: No  . Sexual activity: Yes    Birth control/protection: Post-menopausal    Other Topics Concern  . Not on file  Social History Narrative  . Not on file   Social Determinants of Health   Financial Resource Strain:   . Difficulty of Paying Living Expenses: Not on file  Food Insecurity:   . Worried About Charity fundraiser in the Last Year: Not on file  . Ran Out of Food in the Last Year: Not on file  Transportation Needs:   . Lack of Transportation (Medical): Not on file  . Lack of Transportation (Non-Medical): Not on file  Physical Activity:   . Days of Exercise per Week: Not on file  . Minutes of Exercise per Session: Not on file  Stress:   . Feeling of Stress : Not on file  Social Connections:   . Frequency of Communication with Friends and Family: Not on file  . Frequency of Social Gatherings with Friends and Family: Not on file  . Attends Religious Services: Not on file  . Active Member of Clubs or Organizations: Not on file  . Attends Archivist Meetings: Not on file  . Marital Status: Not on file  Intimate Partner Violence:   . Fear of Current or Ex-Partner: Not on file  . Emotionally Abused: Not on file  . Physically Abused: Not on file  . Sexually Abused: Not on  file    Review of Systems: See HPI, otherwise negative ROS  Physical Exam: BP (!) 148/92   Pulse 78   Temp (!) 97 F (36.1 C) (Temporal)   Resp 16   Ht 5' (1.524 m)   Wt 70.3 kg   LMP 01/12/2016   SpO2 100%   BMI 30.27 kg/m  General:   Alert,  pleasant and cooperative in NAD Head:  Normocephalic and atraumatic. Neck:  Supple; no masses or thyromegaly. Lungs:  Clear throughout to auscultation, normal respiratory effort.    Heart:  +S1, +S2, Regular rate and rhythm, No edema. Abdomen:  Soft, nontender and nondistended. Normal bowel sounds, without guarding, and without rebound.   Neurologic:  Alert and  oriented x4;  grossly normal neurologically.  Impression/Plan: Diane Bates is here for a colonoscopy to be performed for average risk  screening.  Risks, benefits, limitations, and alternatives regarding  colonoscopy have been reviewed with the patient.  Questions have been answered.  All parties agreeable.   [name], MD  06/04/2020, 11:05 AM

## 2020-06-04 NOTE — Op Note (Signed)
[facility] Gastroenterology Patient Name: Diane Bates Procedure Date: 06/04/2020 11:22 AM MRN: [id number] Account #: [id number] Date of Birth: 1971/02/10 Admit Type: Outpatient Age: 49 Room: [facility] ENDO ROOM 1 Gender: Female Note Status: Finalized Procedure:             Colonoscopy Indications:           Screening for colorectal malignant neoplasm Providers:             Parthiv Mucci B. Bonna Gains MD, MD Referring MD:          Forest Gleason Md, MD (Referring MD) Medicines:             Monitored Anesthesia Care Complications:         No immediate complications. Procedure:             Pre-Anesthesia Assessment:                        - ASA Grade Assessment: II - A patient with mild                         systemic disease.                        - Prior to the procedure, a History and Physical was                         performed, and patient medications, allergies and                         sensitivities were reviewed. The patient's tolerance                         of previous anesthesia was reviewed.                        - The risks and benefits of the procedure and the                         sedation options and risks were discussed with the                         patient. All questions were answered and informed                         consent was obtained.                        - Patient identification and proposed procedure were                         verified prior to the procedure by the physician, the                         nurse, the anesthesiologist, the anesthetist and the                         technician. The procedure was verified in the                         procedure  room.                        After obtaining informed consent, the colonoscope was                         passed under direct vision. Throughout the procedure,                         the patient's blood pressure, pulse, and oxygen                         saturations were  monitored continuously. The                         Colonoscope was introduced through the anus and                         advanced to the the cecum, identified by appendiceal                         orifice and ileocecal valve. The colonoscopy was                         performed with ease. The patient tolerated the                         procedure well. The quality of the bowel preparation                         was good. Findings:      The perianal and digital rectal examinations were normal.      Two flat and sessile polyps were found in the sigmoid colon. The polyps       were 5 to 7 mm in size. These polyps were removed with a cold snare.       Resection and retrieval were complete.      The exam was otherwise without abnormality.      The rectum, sigmoid colon, descending colon, transverse colon, ascending       colon and cecum appeared normal.      The retroflexed view of the distal rectum and anal verge was normal and       showed no anal or rectal abnormalities. Impression:            - Two 5 to 7 mm polyps in the sigmoid colon, removed                         with a cold snare. Resected and retrieved.                        - The examination was otherwise normal.                        - The rectum, sigmoid colon, descending colon,                         transverse colon, ascending colon and cecum are normal.                        -  The distal rectum and anal verge are normal on                         retroflexion view. Recommendation:        - Discharge patient to home (with escort).                        - Advance diet as tolerated.                        - Continue present medications.                        - Await pathology results.                        - Repeat colonoscopy date to be determined after                         pending pathology results are reviewed.                        - The findings and recommendations were discussed with                          the patient.                        - The findings and recommendations were discussed with                         the patient's family.                        - Return to primary care physician as previously                         scheduled. Procedure Code(s):     --- Professional ---                        [phone], Colonoscopy, flexible; with removal of                         tumor(s), polyp(s), or other lesion(s) by snare                         technique Diagnosis Code(s):     --- Professional ---                        Z12.11, Encounter for screening for malignant neoplasm                         of colon                        K63.5, Polyp of colon CPT copyright 2019 American Medical Association. All rights reserved. The codes documented in this report are preliminary and upon coder review may  be revised to meet current compliance requirements.  Vonda Antigua, MD Margretta Sidle B. Bonna Gains MD, MD 06/04/2020 11:54:28 AM This report has been signed electronically. Number of  Addenda: 0 Note Initiated On: 06/04/2020 11:22 AM Scope Withdrawal Time: 0 hours 17 minutes 54 seconds  Total Procedure Duration: 0 hours 20 minutes 43 seconds  Estimated Blood Loss:  Estimated blood loss: none.      [facility]

## 2020-06-04 NOTE — Anesthesia Preprocedure Evaluation (Signed)
Anesthesia Evaluation  Patient identified by MRN, date of birth, ID band Patient awake    Reviewed: Allergy & Precautions, NPO status , Patient's Chart, lab work & pertinent test results  History of Anesthesia Complications Negative for: history of anesthetic complications  Airway Mallampati: II       Dental   Pulmonary neg pulmonary ROS,           Cardiovascular negative cardio ROS       Neuro/Psych negative neurological ROS     GI/Hepatic negative GI ROS, Neg liver ROS,   Endo/Other  negative endocrine ROS  Renal/GU negative Renal ROS     Musculoskeletal   Abdominal   Peds  Hematology negative hematology ROS (+)   Anesthesia Other Findings   Reproductive/Obstetrics                             Anesthesia Physical  Anesthesia Plan  ASA: II  Anesthesia Plan: General   Post-op Pain Management:    Induction: Intravenous  PONV Risk Score and Plan: Propofol infusion  Airway Management Planned: Nasal Cannula  Additional Equipment:   Intra-op Plan:   Post-operative Plan:   Informed Consent: I have reviewed the patients History and Physical, chart, labs and discussed the procedure including the risks, benefits and alternatives for the proposed anesthesia with the patient or authorized representative who has indicated his/her understanding and acceptance.       Plan Discussed with: CRNA and Surgeon  Anesthesia Plan Comments:         Anesthesia Quick Evaluation

## 2020-06-05 ENCOUNTER — Encounter: Payer: Self-pay | Admitting: Gastroenterology

## 2020-06-05 LAB — SURGICAL PATHOLOGY

## 2020-06-08 ENCOUNTER — Encounter: Payer: Self-pay | Admitting: Gastroenterology

## 2020-07-21 ENCOUNTER — Encounter: Payer: Self-pay | Admitting: Obstetrics and Gynecology

## 2020-07-21 ENCOUNTER — Encounter: Payer: PRIVATE HEALTH INSURANCE | Admitting: Obstetrics and Gynecology

## 2020-09-07 ENCOUNTER — Telehealth: Payer: Self-pay | Admitting: Surgery

## 2020-09-07 ENCOUNTER — Encounter: Payer: Self-pay | Admitting: *Deleted

## 2020-09-07 DIAGNOSIS — Z9189 Other specified personal risk factors, not elsewhere classified: Secondary | ICD-10-CM

## 2020-09-07 MED ORDER — TAMOXIFEN CITRATE 20 MG PO TABS: 20.0000 mg | ORAL_TABLET | Freq: Every day | ORAL | 12 refills | Status: DC

## 2020-09-07 NOTE — Telephone Encounter (Signed)
Pt's daughter, Dawson Bills, called regarding her mothers rx for Tamoxifen 20 mg (qd) that she is supposed to be taking for 5 yrs post breast surgery (12/2015).  At this time the question is if she should continue to take this medication & if so, she will need a refill sent to [facility] [facility] [place]).  Please advise.  Thank you

## 2020-09-07 NOTE — Telephone Encounter (Signed)
Tamoxifen sent to pharmacy. Spoke with daughter.

## 2020-09-11 NOTE — Progress Notes (Signed)
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  She was subsequently placed on tamoxifen for prophylaxis by her surgeon and was referred to clinic for evaluation and assessed the length of time of treatment.  She currently feels well and is asymptomatic.  She has been tolerating tamoxifen well without significant side effects.  She has no neurologic complaints.  She denies any recent fevers or illnesses.  She has good appetite and denies weight loss.  She has no chest pain, shortness of breath, cough, or hemoptysis.  She denies any nausea, vomiting, constipation, or diarrhea.  She has no urinary complaints.  Patient feels at her baseline offers no specific complaints today.  REVIEW OF SYSTEMS:   Review of Systems  Constitutional: Negative.  Negative for fever and weight loss.  Respiratory: Negative.  Negative for cough and shortness of breath.   Cardiovascular: Negative.  Negative for chest pain and leg swelling.  Gastrointestinal: Negative.  Negative for abdominal pain.  Genitourinary: Negative.  Negative for dysuria.  Musculoskeletal: Negative.  Negative for back pain.  Skin: Negative.  Negative for rash.  Neurological: Negative.  Negative for dizziness, weakness and headaches.  Psychiatric/Behavioral: Negative.  The patient is not nervous/anxious.     As per HPI. Otherwise, a complete review of systems is negative.  PAST MEDICAL HISTORY: Past Medical History:  Diagnosis Date  . Cancer [facility])    breast cancer    PAST SURGICAL  HISTORY: Past Surgical History:  Procedure Laterality Date  . BREAST BIOPSY Left 12/14/2015   stereo, atypical hyperplasia  . BREAST BIOPSY Left 01/14/2016   Procedure: BREAST BIOPSY;  Surgeon: [name], MD;  Location: [facility] ORS;  Service: General;  Laterality: Left;  . BREAST EXCISIONAL BIOPSY Left 01/14/2016   ADH  . CHOLECYSTECTOMY  2000  . COLONOSCOPY WITH PROPOFOL N/A 06/04/2020   Procedure: COLONOSCOPY WITH PROPOFOL;  Surgeon: [name], MD;  Location: [facility] ENDOSCOPY;  Service: Endoscopy;  Laterality: N/A;    FAMILY HISTORY: Family History  Problem Relation Age of Onset  . Healthy Mother   . Breast cancer Neg Hx     ADVANCED DIRECTIVES (Y/N):  N  HEALTH MAINTENANCE: Social History   Tobacco Use  . Smoking status: Never Smoker  . Smokeless tobacco: Never Used  Vaping Use  . Vaping Use: Never used  Substance Use Topics  . Alcohol use: No    Alcohol/week: 0.0 standard drinks  . Drug use: No     Colonoscopy:  PAP:  Bone density:  Lipid panel:  No Known Allergies  Current Outpatient Medications  Medication Sig Dispense Refill  . acetaminophen (TYLENOL) 500 MG tablet Take 500 mg by mouth every 6 (six) hours as needed.    Marland Kitchen aspirin EC 81 MG tablet Take 81 mg by mouth daily.    . tamoxifen (NOLVADEX) 20 MG tablet Take 1 tablet (20 mg total) by mouth daily. 90 tablet 4   No current facility-administered medications for this visit.    OBJECTIVE: Vitals:   09/14/20 1122  BP: 138/79  Pulse: 81  Resp: 20  Temp: (!) 97.4  F (36.3 C)     There is no height or weight on file to calculate BMI.    ECOG FS:0 - Asymptomatic  General: Well-developed, well-nourished, no acute distress. Eyes: Pink conjunctiva, anicteric sclera. HEENT: Normocephalic, moist mucous membranes. Lungs: No audible wheezing or coughing. Heart: Regular rate and rhythm. Abdomen: Soft, nontender, no obvious distention. Musculoskeletal: No edema, cyanosis, or clubbing. Neuro:  Alert, answering all questions appropriately. Cranial nerves grossly intact. Skin: No rashes or petechiae noted. Psych: Normal affect. Lymphatics: No cervical, calvicular, axillary or inguinal LAD.   LAB RESULTS:  No results found for: NA, K, CL, CO2, GLUCOSE, BUN, CREATININE, CALCIUM, PROT, ALBUMIN, AST, ALT, ALKPHOS, BILITOT, GFRNONAA, GFRAA  No results found for: WBC, NEUTROABS, HGB, HCT, MCV, PLT   STUDIES: No results found.  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  PLAN:    1. 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  She was subsequently placed on prophylactic tamoxifen.  She reports normal mammograms since that time.  Agree with completing a total of 5 years of prophylactic tamoxifen in June 2022.  We discussed the fact that despite taking prophylactic tamoxifen, she is still is at higher risk for developing malignancy than the general population.  She agreed to continue getting her yearly mammograms and primary care follow-up for breast exam.  No further interventions are needed.  No further follow-up has been scheduled.  Please refer patient back if there are any questions or concerns.  Entire visit was done in the presence of an interpreter.  I spent a total of 45 minutes reviewing chart data, face-to-face evaluation with the patient, counseling and coordination of care as detailed above.   Patient expressed understanding and was in agreement with this plan. She also understands that She can call clinic at any time with any questions, concerns, or complaints.    Lloyd Huger, MD   09/14/2020 2:57 PM

## 2020-09-14 ENCOUNTER — Inpatient Hospital Stay: Payer: Self-pay

## 2020-09-14 ENCOUNTER — Inpatient Hospital Stay: Payer: Self-pay | Attending: Oncology | Admitting: Oncology

## 2020-09-14 ENCOUNTER — Encounter: Payer: Self-pay | Admitting: Oncology

## 2020-09-14 VITALS — BP 138/79 | HR 81 | Temp 97.4°F | Resp 20

## 2020-09-14 DIAGNOSIS — N6092 Unspecified benign mammary dysplasia of left breast: Secondary | ICD-10-CM | POA: Insufficient documentation

## 2020-09-14 DIAGNOSIS — Z79810 Long term (current) use of selective estrogen receptor modulators (SERMs): Secondary | ICD-10-CM | POA: Insufficient documentation

## 2020-09-14 NOTE — Progress Notes (Signed)
Patient denies any concerns today, here for initial visit to discuss continuation of Tamoxifen.

## 2021-04-28 ENCOUNTER — Ambulatory Visit: Payer: Self-pay | Attending: Oncology

## 2021-04-28 ENCOUNTER — Other Ambulatory Visit: Payer: Self-pay

## 2021-04-28 ENCOUNTER — Ambulatory Visit
Admission: RE | Admit: 2021-04-28 | Discharge: 2021-04-28 | Disposition: A | Discharge status: Home / Self Care | Payer: Self-pay | Source: Ambulatory Visit | Attending: Oncology | Admitting: Oncology

## 2021-04-28 VITALS — BP 120/62 | HR 68 | Temp 98.7°F | Resp 16 | Wt 154.4 lb

## 2021-04-28 DIAGNOSIS — Z0000 Encounter for general adult medical examination without abnormal findings: Secondary | ICD-10-CM

## 2021-04-28 NOTE — Progress Notes (Signed)
  Subjective:     Patient ID: Diane Bates, female   DOB: July 02, 1971, 50 y.o.   MRN: [id number]  HPI   Review of Systems     Objective:   Physical Exam Chest:  Breasts:    Right: No swelling, bleeding, inverted nipple, mass, nipple discharge, skin change or tenderness.     Left: No swelling, bleeding, inverted nipple, mass, nipple discharge, skin change or tenderness.       Comments: Biopsy scar left breast      Assessment:     50 year old Hispanic patient returns for annual BCCCP screening.  [name] interpreted exam.  Patient screened, and meets BCCCP eligibility.  Patient does not have insurance, Medicare or Medicaid.  Instructed patient on breast self awareness using teach back method Clinical breast exam unremarkable.  Risk Assessment     Risk Scores       04/28/2021 04/22/2020   Last edited by: Velna Hatchet, CMA [place], [place], [place]   5-year risk: 1.7 % 1.7 %   Lifetime risk: 13.7 % 13.9 %               Plan:     Sent for bilateral screening mammogram.

## 2021-05-13 NOTE — Progress Notes (Signed)
Letter mailed from [facility] to notify of normal mammogram results.  Patient to return in one year for annual screening.  Copy to [facility]. 

## 2022-04-27 ENCOUNTER — Other Ambulatory Visit: Payer: Self-pay

## 2022-04-27 DIAGNOSIS — Z1231 Encounter for screening mammogram for malignant neoplasm of breast: Secondary | ICD-10-CM

## 2022-05-03 ENCOUNTER — Ambulatory Visit
Admission: RE | Admit: 2022-05-03 | Discharge: 2022-05-03 | Disposition: A | Discharge status: Home / Self Care | Payer: Self-pay | Source: Ambulatory Visit | Attending: Obstetrics and Gynecology | Admitting: Obstetrics and Gynecology

## 2022-05-03 ENCOUNTER — Ambulatory Visit: Payer: Self-pay | Attending: Hematology and Oncology | Admitting: Hematology and Oncology

## 2022-05-03 VITALS — BP 139/81 | Wt 159.8 lb

## 2022-05-03 DIAGNOSIS — Z1231 Encounter for screening mammogram for malignant neoplasm of breast: Secondary | ICD-10-CM | POA: Insufficient documentation

## 2022-05-03 NOTE — Progress Notes (Signed)
Diane Bates is a 51 y.o. female who presents to [facility] clinic today with no complaints .    Pap Smear: Pap not smear completed today. Last Pap smear was 2019 at [facility] clinic and was normal. Per patient has no history of an abnormal Pap smear. Last Pap smear result is available in Epic.   Physical exam: Breasts Breasts symmetrical. No skin abnormalities bilateral breasts. No nipple retraction bilateral breasts. No nipple discharge bilateral breasts. No lymphadenopathy. No lumps palpated bilateral breasts.     MS DIGITAL SCREENING TOMO BILATERAL  Result Date: 05/04/2021 CLINICAL DATA:  Screening. EXAM: DIGITAL SCREENING BILATERAL MAMMOGRAM WITH TOMOSYNTHESIS AND CAD TECHNIQUE: Bilateral screening digital craniocaudal and mediolateral oblique mammograms were obtained. Bilateral screening digital breast tomosynthesis was performed. The images were evaluated with computer-aided detection. COMPARISON:  Previous exam(s). ACR Breast Density Category c: The breast tissue is heterogeneously dense, which may obscure small masses. FINDINGS: There are no findings suspicious for malignancy. IMPRESSION: No mammographic evidence of malignancy. A result letter of this screening mammogram will be mailed directly to the patient. RECOMMENDATION: Screening mammogram in one year. (Code:SM-B-01Y) BI-RADS CATEGORY  1: Negative. Electronically Signed   By: Lillia Mountain M.D.   On: 05/04/2021 13:59  MS DIGITAL SCREENING TOMO BILATERAL  Result Date: 04/23/2020 CLINICAL DATA:  Screening. EXAM: DIGITAL SCREENING BILATERAL MAMMOGRAM WITH TOMO AND CAD COMPARISON:  Previous exam(s). ACR Breast Density Category b: There are scattered areas of fibroglandular density. FINDINGS: There are no findings suspicious for malignancy. Images were processed with CAD. IMPRESSION: No mammographic evidence of malignancy. A result letter of this screening mammogram will be mailed directly to the patient. RECOMMENDATION: Screening mammogram  in one year. (Code:SM-B-01Y) BI-RADS CATEGORY  1: Negative. Electronically Signed   By: Dorise Bullion III M.D   On: 04/23/2020 12:07   MS DIGITAL SCREENING TOMO BILATERAL  Result Date: 03/20/2019 CLINICAL DATA:  Screening. EXAM: DIGITAL SCREENING BILATERAL MAMMOGRAM WITH TOMO AND CAD COMPARISON:  Previous exam(s). ACR Breast Density Category c: The breast tissue is heterogeneously dense, which may obscure small masses. FINDINGS: There are no findings suspicious for malignancy. Images were processed with CAD. IMPRESSION: No mammographic evidence of malignancy. A result letter of this screening mammogram will be mailed directly to the patient. RECOMMENDATION: Screening mammogram in one year. (Code:SM-B-01Y) BI-RADS CATEGORY  1: Negative. Electronically Signed   By: Ammie Ferrier M.D.   On: 03/20/2019 12:57   MS DIGITAL SCREENING TOMO BILATERAL  Result Date: 12/11/2017 CLINICAL DATA:  Screening. EXAM: DIGITAL SCREENING BILATERAL MAMMOGRAM WITH TOMO AND CAD COMPARISON:  Previous exam(s). ACR Breast Density Category c: The breast tissue is heterogeneously dense, which may obscure small masses. FINDINGS: There are no findings suspicious for malignancy. Images were processed with CAD. IMPRESSION: No mammographic evidence of malignancy. A result letter of this screening mammogram will be mailed directly to the patient. RECOMMENDATION: Screening mammogram in one year. (Code:SM-B-01Y) BI-RADS CATEGORY  1: Negative. Electronically Signed   By: Lajean Manes M.D.   On: 12/11/2017 12:44      Pelvic/Bimanual Pap is not indicated today    Smoking History: Patient has never smoked and was not referred to quit line.    Patient Navigation: Patient education provided. Access to services provided for patient through [place] interpreter provided. No transportation provided   Colorectal Cancer Screening: Per patient has had colonoscopy completed on 06/04/2020  No complaints today.    Breast  and Cervical Cancer Risk Assessment: Patient does not have  family history of breast cancer, known genetic mutations, or radiation treatment to the chest before age 80. Patient does not have history of cervical dysplasia, immunocompromised, or DES exposure in-utero.  Risk Assessment     Risk Scores       05/03/2022 04/28/2021   Last edited by: Diane Revel, LPN Diane Bates, CMA   5-year risk: 1.8 % 1.7 %   Lifetime risk: 13.4 % 13.7 %            A:  No complaints today with benign exam.  [facility] Appointment scheduled 05/03/2022 at 9:20 am.

## 2023-01-23 ENCOUNTER — Ambulatory Visit: Payer: Self-pay | Attending: Hematology and Oncology | Admitting: Hematology and Oncology

## 2023-01-23 ENCOUNTER — Other Ambulatory Visit: Payer: Self-pay

## 2023-01-23 DIAGNOSIS — Z124 Encounter for screening for malignant neoplasm of cervix: Secondary | ICD-10-CM

## 2023-01-23 NOTE — Progress Notes (Signed)
Patient: Diane Bates           Date of Birth: [birth year]           MRN: [id number] Visit Date: 01/23/2023 PCP: Pcp, No  Cervical Cancer Screening Do you smoke?: No Have you ever had or been told you have an allergy to latex products?: No Marital status: Married Date of last pap smear: 2-5 yrs ago (12/11/2017 Pap/HPV-Negative ([facility]-BCCCP)) Date of last menstrual period:  (Postmenopausal) Number of pregnancies: 5 Number of births: 5 Have you ever had any of the following? Hysterectomy: No Tubal ligation (tubes tied): No Abnormal bleeding: No Abnormal pap smear: No Venereal warts: No A sex partner with venereal warts: No A high risk* sex partner: No  Cervical Exam Pap smear completed: Pap test Abnormal Observations: Normal exam. Recommendations: Repeat in 5 years if normal and HPV-    Patient's History Patient Active Problem List   Diagnosis Date Noted  . Encounter for screening colonoscopy   . Polyp of sigmoid colon   . 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 Cancer [facility])    breast cancer    Family History  Problem Relation Age of Onset  . Healthy Mother   . Breast cancer Neg Hx     Social History   Occupational History  . Not on file  Tobacco Use  . Smoking status: Never  . Smokeless tobacco: Never  Vaping Use  . Vaping Use: Never used  Substance and Sexual Activity  . Alcohol use: No    Alcohol/week: 0.0 standard drinks of alcohol  . Drug use: No  . Sexual activity: Yes    Birth control/protection: Post-menopausal

## 2023-01-26 LAB — CYTOLOGY - PAP
Comment: NEGATIVE
Diagnosis: NEGATIVE
High risk HPV: NEGATIVE

## 2023-01-30 NOTE — Progress Notes (Signed)
Normal pap letter mailed to patient and sent to patient through MyChart.

## 2023-06-27 ENCOUNTER — Other Ambulatory Visit: Payer: Self-pay

## 2023-06-27 DIAGNOSIS — Z1231 Encounter for screening mammogram for malignant neoplasm of breast: Secondary | ICD-10-CM

## 2023-07-03 ENCOUNTER — Ambulatory Visit
Admission: RE | Admit: 2023-07-03 | Discharge: 2023-07-03 | Disposition: A | Discharge status: Home / Self Care | Payer: Self-pay | Source: Ambulatory Visit | Attending: Obstetrics and Gynecology | Admitting: Obstetrics and Gynecology

## 2023-07-03 ENCOUNTER — Ambulatory Visit: Payer: Self-pay | Attending: Hematology and Oncology | Admitting: Hematology and Oncology

## 2023-07-03 VITALS — BP 136/83 | Wt 156.5 lb

## 2023-07-03 DIAGNOSIS — Z1231 Encounter for screening mammogram for malignant neoplasm of breast: Secondary | ICD-10-CM | POA: Insufficient documentation

## 2023-07-03 NOTE — Patient Instructions (Signed)
Taught Diane Bates how to perform BSE and gave educational materials to take home. Patient did not need a Pap smear today due to last Pap smear was in 01/23/2023 per patient. Let her know BCCCP will cover Pap smears every 5 years unless has a history of abnormal Pap smears. Referred patient to the [facility] for screening mammogram. Appointment scheduled for 07/03/2023. Patient aware of appointment and will be there. Let patient know will follow up with her within the next couple weeks with results. Diane Bates verbalized understanding.  Pascal Lux, NP 9:55 AM

## 2023-07-03 NOTE — Progress Notes (Signed)
[name] is a 52 y.o. female who presents to [facility] clinic today with no complaints.    Pap Smear: Pap not smear completed today. Last Pap smear was 01/23/23 at [facility] clinic and was normal. Per patient has no history of an abnormal Pap smear. Last Pap smear result is available in Epic.   Physical exam: Breasts Breasts symmetrical. No skin abnormalities bilateral breasts. No nipple retraction bilateral breasts. No nipple discharge bilateral breasts. No lymphadenopathy. No lumps palpated bilateral breasts.      MS DIGITAL SCREENING TOMO BILATERAL  Result Date: 05/04/2022 CLINICAL DATA:  Screening. EXAM: DIGITAL SCREENING BILATERAL MAMMOGRAM WITH TOMOSYNTHESIS AND CAD TECHNIQUE: Bilateral screening digital craniocaudal and mediolateral oblique mammograms were obtained. Bilateral screening digital breast tomosynthesis was performed. The images were evaluated with computer-aided detection. COMPARISON:  Previous exam(s). ACR Breast Density Category c: The breast tissue is heterogeneously dense, which may obscure small masses. FINDINGS: There are no findings suspicious for malignancy. IMPRESSION: No mammographic evidence of malignancy. A result letter of this screening mammogram will be mailed directly to the patient. RECOMMENDATION: Screening mammogram in one year. (Code:SM-B-01Y) BI-RADS CATEGORY  1: Negative. Electronically Signed   By: Meda Klinefelter M.D.   On: 05/04/2022 07:51   MS DIGITAL SCREENING TOMO BILATERAL  Result Date: 05/04/2021 CLINICAL DATA:  Screening. EXAM: DIGITAL SCREENING BILATERAL MAMMOGRAM WITH TOMOSYNTHESIS AND CAD TECHNIQUE: Bilateral screening digital craniocaudal and mediolateral oblique mammograms were obtained. Bilateral screening digital breast tomosynthesis was performed. The images were evaluated with computer-aided detection. COMPARISON:  Previous exam(s). ACR Breast Density Category c: The breast tissue is heterogeneously dense, which may obscure small  masses. FINDINGS: There are no findings suspicious for malignancy. IMPRESSION: No mammographic evidence of malignancy. A result letter of this screening mammogram will be mailed directly to the patient. RECOMMENDATION: Screening mammogram in one year. (Code:SM-B-01Y) BI-RADS CATEGORY  1: Negative. Electronically Signed   By: Baird Lyons M.D.   On: 05/04/2021 13:59  MS DIGITAL SCREENING TOMO BILATERAL  Result Date: 04/23/2020 CLINICAL DATA:  Screening. EXAM: DIGITAL SCREENING BILATERAL MAMMOGRAM WITH TOMO AND CAD COMPARISON:  Previous exam(s). ACR Breast Density Category b: There are scattered areas of fibroglandular density. FINDINGS: There are no findings suspicious for malignancy. Images were processed with CAD. IMPRESSION: No mammographic evidence of malignancy. A result letter of this screening mammogram will be mailed directly to the patient. RECOMMENDATION: Screening mammogram in one year. (Code:SM-B-01Y) BI-RADS CATEGORY  1: Negative. Electronically Signed   By: Gerome Sam III M.D   On: 04/23/2020 12:07   MS DIGITAL SCREENING TOMO BILATERAL  Result Date: 03/20/2019 CLINICAL DATA:  Screening. EXAM: DIGITAL SCREENING BILATERAL MAMMOGRAM WITH TOMO AND CAD COMPARISON:  Previous exam(s). ACR Breast Density Category c: The breast tissue is heterogeneously dense, which may obscure small masses. FINDINGS: There are no findings suspicious for malignancy. Images were processed with CAD. IMPRESSION: No mammographic evidence of malignancy. A result letter of this screening mammogram will be mailed directly to the patient. RECOMMENDATION: Screening mammogram in one year. (Code:SM-B-01Y) BI-RADS CATEGORY  1: Negative. Electronically Signed   By: Frederico Hamman M.D.   On: 03/20/2019 12:57    Pelvic/Bimanual Pap is not indicated today    Smoking History: Patient has never smoked and was not referred to quit line.    Patient Navigation: Patient education provided. Access to services provided for  patient through BCCCP program. [place] interpreter provided. No transportation provided   Colorectal Cancer Screening: Per patient has had colonoscopy completed on  06/04/2020 revealing colon polyp x2, sigmoid: colon snare: Tubular adenoma, negative for high grade dysplasia and malignancy, hyperplastic polyp, negative for dysplasia and malignancy.  No complaints today. Will repeat in 2026.   Breast and Cervical Cancer Risk Assessment: Patient does not have family history of breast cancer, known genetic mutations, or radiation treatment to the chest before age 81. Patient does not have history of cervical dysplasia, immunocompromised, or DES exposure in-utero.   Risk Scores as of Encounter on 07/03/2023     Diane Bates           5-year 1.73%   Lifetime 12.44%   This patient is Hisp[place]/Latina but has no documented birth country, so the [place] model used data from [place] patients to calculate their risk score. Document a birth country in the Demographics activity for a more accurate score.         Last calculated by Diane Rutherford, LPN on 16/08/958 at 10:01 AM         A: BCCCP exam without pap smear No complaints with benign exam.   P: Referred patient to the [facility] for a screening mammogram. Appointment scheduled 07/03/2023.  Pascal Lux, NP 07/03/2023 9:52 AM

## 2024-07-12 ENCOUNTER — Other Ambulatory Visit: Payer: Self-pay

## 2024-07-12 DIAGNOSIS — Z1231 Encounter for screening mammogram for malignant neoplasm of breast: Secondary | ICD-10-CM

## 2024-07-18 ENCOUNTER — Other Ambulatory Visit: Payer: Self-pay | Admitting: Family Medicine

## 2024-07-18 DIAGNOSIS — Z1231 Encounter for screening mammogram for malignant neoplasm of breast: Secondary | ICD-10-CM

## 2024-08-06 ENCOUNTER — Ambulatory Visit
Admission: RE | Admit: 2024-08-06 | Discharge: 2024-08-06 | Disposition: A | Discharge status: Home / Self Care | Payer: Self-pay | Source: Ambulatory Visit | Attending: Family Medicine | Admitting: Family Medicine

## 2024-08-06 ENCOUNTER — Ambulatory Visit: Payer: Self-pay

## 2024-08-06 DIAGNOSIS — Z1231 Encounter for screening mammogram for malignant neoplasm of breast: Secondary | ICD-10-CM | POA: Insufficient documentation

## 2024-08-23 ENCOUNTER — Encounter: Payer: Self-pay | Admitting: Family Medicine
